# Patient Record
Sex: Female | Born: 1960 | Race: White | Hispanic: No | Marital: Married | Smoking: Former smoker
Health system: Southern US, Community
[De-identification: ages and names within clinical notes are randomized; demographics above are authoritative.]

## PROBLEM LIST (undated history)

## (undated) DIAGNOSIS — Z8701 Personal history of pneumonia (recurrent): Secondary | ICD-10-CM

## (undated) DIAGNOSIS — E785 Hyperlipidemia, unspecified: Secondary | ICD-10-CM

## (undated) DIAGNOSIS — B9681 Helicobacter pylori [H. pylori] as the cause of diseases classified elsewhere: Principal | ICD-10-CM

## (undated) DIAGNOSIS — K259 Gastric ulcer, unspecified as acute or chronic, without hemorrhage or perforation: Secondary | ICD-10-CM

## (undated) DIAGNOSIS — M549 Dorsalgia, unspecified: Secondary | ICD-10-CM

## (undated) DIAGNOSIS — Z862 Personal history of diseases of the blood and blood-forming organs and certain disorders involving the immune mechanism: Secondary | ICD-10-CM

## (undated) DIAGNOSIS — M1990 Unspecified osteoarthritis, unspecified site: Secondary | ICD-10-CM

## (undated) DIAGNOSIS — K5909 Other constipation: Secondary | ICD-10-CM

## (undated) DIAGNOSIS — G8929 Other chronic pain: Secondary | ICD-10-CM

## (undated) DIAGNOSIS — Z8709 Personal history of other diseases of the respiratory system: Secondary | ICD-10-CM

## (undated) DIAGNOSIS — R51 Headache: Secondary | ICD-10-CM

## (undated) DIAGNOSIS — K2970 Gastritis, unspecified, without bleeding: Principal | ICD-10-CM

## (undated) DIAGNOSIS — F419 Anxiety disorder, unspecified: Secondary | ICD-10-CM

## (undated) DIAGNOSIS — U071 COVID-19: Secondary | ICD-10-CM

## (undated) DIAGNOSIS — R030 Elevated blood-pressure reading, without diagnosis of hypertension: Secondary | ICD-10-CM

## (undated) DIAGNOSIS — A4902 Methicillin resistant Staphylococcus aureus infection, unspecified site: Secondary | ICD-10-CM

## (undated) DIAGNOSIS — G4700 Insomnia, unspecified: Secondary | ICD-10-CM

## (undated) HISTORY — DX: Hyperlipidemia, unspecified: E78.5

## (undated) HISTORY — DX: Gastritis, unspecified, without bleeding: K29.70

## (undated) HISTORY — DX: Anxiety disorder, unspecified: F41.9

## (undated) HISTORY — DX: Elevated blood-pressure reading, without diagnosis of hypertension: R03.0

## (undated) HISTORY — PX: CARPAL TUNNEL RELEASE: SHX101

## (undated) HISTORY — DX: Morbid (severe) obesity due to excess calories: E66.01

## (undated) HISTORY — DX: Helicobacter pylori (H. pylori) as the cause of diseases classified elsewhere: B96.81

## (undated) HISTORY — PX: TUBAL LIGATION: SHX77

## (undated) HISTORY — DX: Methicillin resistant Staphylococcus aureus infection, unspecified site: A49.02

## (undated) HISTORY — DX: Personal history of pneumonia (recurrent): Z87.01

## (undated) HISTORY — DX: Other constipation: K59.09

## (undated) HISTORY — DX: Personal history of diseases of the blood and blood-forming organs and certain disorders involving the immune mechanism: Z86.2

---

## 2002-12-23 HISTORY — PX: GALLBLADDER SURGERY: SHX652

## 2002-12-30 ENCOUNTER — Ambulatory Visit (HOSPITAL_COMMUNITY): Admission: RE | Admit: 2002-12-30 | Discharge: 2002-12-30 | Payer: Self-pay | Admitting: Family Medicine

## 2002-12-30 ENCOUNTER — Encounter: Payer: Self-pay | Admitting: Family Medicine

## 2003-01-05 ENCOUNTER — Encounter: Payer: Self-pay | Admitting: Family Medicine

## 2003-01-05 ENCOUNTER — Ambulatory Visit (HOSPITAL_COMMUNITY): Admission: RE | Admit: 2003-01-05 | Discharge: 2003-01-05 | Payer: Self-pay | Admitting: Family Medicine

## 2003-06-04 ENCOUNTER — Emergency Department (HOSPITAL_COMMUNITY): Admission: EM | Admit: 2003-06-04 | Discharge: 2003-06-04 | Payer: Self-pay | Admitting: *Deleted

## 2003-06-06 ENCOUNTER — Ambulatory Visit (HOSPITAL_COMMUNITY): Admission: RE | Admit: 2003-06-06 | Discharge: 2003-06-06 | Payer: Self-pay | Admitting: Unknown Physician Specialty

## 2003-06-06 ENCOUNTER — Encounter: Payer: Self-pay | Admitting: *Deleted

## 2003-06-14 ENCOUNTER — Encounter: Payer: Self-pay | Admitting: General Surgery

## 2003-06-14 ENCOUNTER — Ambulatory Visit (HOSPITAL_COMMUNITY): Admission: RE | Admit: 2003-06-14 | Discharge: 2003-06-14 | Payer: Self-pay | Admitting: General Surgery

## 2004-03-29 ENCOUNTER — Ambulatory Visit (HOSPITAL_COMMUNITY): Admission: RE | Admit: 2004-03-29 | Discharge: 2004-03-29 | Payer: Self-pay | Admitting: Family Medicine

## 2004-07-30 ENCOUNTER — Ambulatory Visit (HOSPITAL_COMMUNITY): Admission: RE | Admit: 2004-07-30 | Discharge: 2004-07-30 | Payer: Self-pay | Admitting: Family Medicine

## 2004-08-16 ENCOUNTER — Ambulatory Visit (HOSPITAL_COMMUNITY): Admission: RE | Admit: 2004-08-16 | Discharge: 2004-08-16 | Payer: Self-pay | Admitting: Family Medicine

## 2005-05-16 ENCOUNTER — Emergency Department (HOSPITAL_COMMUNITY): Admission: EM | Admit: 2005-05-16 | Discharge: 2005-05-16 | Payer: Self-pay | Admitting: Emergency Medicine

## 2005-05-17 ENCOUNTER — Ambulatory Visit (HOSPITAL_COMMUNITY): Admission: RE | Admit: 2005-05-17 | Discharge: 2005-05-17 | Payer: Self-pay | Admitting: Family Medicine

## 2006-07-21 ENCOUNTER — Ambulatory Visit (HOSPITAL_COMMUNITY): Admission: RE | Admit: 2006-07-21 | Discharge: 2006-07-21 | Payer: Self-pay | Admitting: Family Medicine

## 2007-01-22 ENCOUNTER — Ambulatory Visit (HOSPITAL_COMMUNITY): Admission: RE | Admit: 2007-01-22 | Discharge: 2007-01-22 | Payer: Self-pay | Admitting: Family Medicine

## 2007-01-27 ENCOUNTER — Ambulatory Visit (HOSPITAL_COMMUNITY): Admission: RE | Admit: 2007-01-27 | Discharge: 2007-01-27 | Payer: Self-pay | Admitting: Family Medicine

## 2009-02-03 ENCOUNTER — Ambulatory Visit (HOSPITAL_COMMUNITY): Admission: RE | Admit: 2009-02-03 | Discharge: 2009-02-03 | Payer: Self-pay | Admitting: Internal Medicine

## 2009-06-23 ENCOUNTER — Ambulatory Visit (HOSPITAL_COMMUNITY): Admission: RE | Admit: 2009-06-23 | Discharge: 2009-06-23 | Payer: Self-pay | Admitting: Family Medicine

## 2010-03-13 ENCOUNTER — Ambulatory Visit (HOSPITAL_COMMUNITY): Admission: RE | Admit: 2010-03-13 | Discharge: 2010-03-13 | Payer: Self-pay | Admitting: Family Medicine

## 2011-04-05 ENCOUNTER — Other Ambulatory Visit (HOSPITAL_COMMUNITY)
Admission: RE | Admit: 2011-04-05 | Discharge: 2011-04-05 | Disposition: A | Discharge status: Home / Self Care | Payer: Managed Care, Other (non HMO) | Source: Ambulatory Visit | Attending: Obstetrics & Gynecology | Admitting: Obstetrics & Gynecology

## 2011-04-05 ENCOUNTER — Other Ambulatory Visit: Payer: Self-pay | Admitting: Obstetrics & Gynecology

## 2011-04-05 DIAGNOSIS — Z01419 Encounter for gynecological examination (general) (routine) without abnormal findings: Secondary | ICD-10-CM | POA: Insufficient documentation

## 2011-05-10 NOTE — H&P (Signed)
NAME:  Tammy Webster, Tammy Webster NO.:  [id number]   MEDICAL RECORD NO.:  [id number]                   PATIENT TYPE:  OUT   LOCATION:  RAD                                  FACILITY:  [facility]   PHYSICIAN:  Dalia Heading, M.D.               DATE OF BIRTH:  27-Sep-1961   DATE OF ADMISSION:  06/06/2003  DATE OF DISCHARGE:  06/06/2003                                HISTORY & PHYSICAL   CHIEF COMPLAINT:  Biliary colic secondary to cholelithiasis.   HISTORY OF PRESENT ILLNESS:  The patient is a 50 year old white female who  was referred for evaluation and treatment of biliary colic secondary to  cholelithiasis.  She had an episode of right upper quadrant abdominal pain  with radiation to the right flank and back, nausea, postprandial symptoms,  fatty food intolerance, indigestion, and bloating this past weekend and was  seen in the emergency room.  She was noted to have elevated liver enzyme  tests.  An ultrasound of the gallbladder revealed cholelithiasis with a  slightly dilated common bile duct.  No choledocholithiasis was seen.  She  had an additional episode of biliary colic yesterday which resolved without  incident.   PAST MEDICAL HISTORY:  Unremarkable.   PAST SURGICAL HISTORY:  Tubal ligation.   CURRENT MEDICATIONS:  Oxycodone as needed for pain.   ALLERGIES:  No known drug allergies.   REVIEW OF SYSTEMS:  The patient does smoke a pack and one-half of cigarettes  a day.  She socially drinks alcohol.  She denies any other cardiopulmonary  difficulties or bleeding disorders.   PHYSICAL EXAMINATION:  GENERAL:  The patient is a well developed, well  nourished white female in no acute distress.  VITAL SIGNS:  She is afebrile, and vital signs are stable.  LUNGS:  Clear to auscultation with equal breath sounds bilaterally.  HEART:  Regular rate and rhythm without S3, S4, or murmurs.  ABDOMEN:  Soft with tenderness in the right upper quadrant to palpation.   No  hepatosplenomegaly, masses, or hernias are noted.   IMPRESSION:  Biliary colic, cholelithiasis.    PLAN:  The patient is scheduled for a laparoscopic cholecystectomy with  cholangiograms on 06/15/2003.  The risks and benefits of the procedure  including bleeding,infection, hepatobiliary injury, and the possibility of  an open procedure were fully explained to the patient who gave informed  consent.                                               Dalia Heading, M.D.    MAJ/MEDQ  D:  06/09/2003  T:  06/09/2003  Job:  [id number]   cc:   Kirk Ruths, M.D.  [address]  [place]  [place] [address]  Fax: [phone]

## 2011-05-10 NOTE — Op Note (Signed)
NAME:  Tammy Webster, Tammy Webster                           ACCOUNT NO.:  [id number]   MEDICAL RECORD NO.:  [id number]                   PATIENT TYPE:  AMB   LOCATION:  DAY                                  FACILITY:  [facility]   PHYSICIAN:  Dalia Heading, M.D.               DATE OF BIRTH:  [birth year]   DATE OF PROCEDURE:  06/14/2003  DATE OF DISCHARGE:                                 OPERATIVE REPORT   PREOPERATIVE DIAGNOSIS:  Acute cholecystitis, cholelithiasis.   POSTOPERATIVE DIAGNOSIS:  Acute cholecystitis, cholelithiasis.   PROCEDURE:  Laparoscopic cholecystectomy with cholangiograms.   SURGEON:  Dalia Heading, M.D.   ASSISTANT:  Bernerd Limbo. Leona Carry, M.D.   ANESTHESIA:  General endotracheal.   INDICATIONS FOR PROCEDURE:  The patient is a 50 year old white female who  was referred for evaluation and treatment of biliary colic secondary to  cholelithiasis.  She also has a slightly dilated common bile duct and  elevated liver enzymes.  The risks and benefits of the procedure, including  bleeding, infection, hepatobiliary injury, and the possibility of an open  procedure were fully explained to the patient who gave informed consent.   DESCRIPTION OF PROCEDURE:  The patient was placed in the supine position.  After induction of general endotracheal anesthesia, the abdomen was prepped  and draped using the usual sterile technique with Betadine.  Surgical site  confirmation was performed.   A supraumbilical incision was made down to the fascia.  A Veress needle was  introduced into the abdominal cavity, and confirmation of placement was done  using the saline drop test.  The abdomen was then insufflated to 16 mmHg  pressure.  An 11-mm trocar was introduced into the abdominal cavity under  direct visualization without difficulty.  The patient was placed in reverse  Trendelenburg position, and an 11-mm trocar was placed in the epigastric  region and 5-mm trocars were placed in the right upper  quadrant and right  flank regions.  The liver was inspected and noted to be within normal  limits.  The gallbladder was retracted superiorly and laterally.  The  dissection was begun around the infundibulum of the gallbladder.  The cystic  duct was first identified.  Its juncture to the infundibulum was fully  identified.  A single Endoclip was placed proximally on the cystic duct, and  an incision was made in the cystic duct.  The cholangiocatheter was then  inserted, and under digital fluoroscopy, a cholangiogram was performed.  No  hepatobiliary defects were noted.  The dye flowed freely into the duodenum.  The common bile duct was then flushed with saline, and the catheter was  removed.  Multiple Endoclips were placed distally on the cystic duct, and  the cystic duct was divided.  The cystic artery was likewise divided and  ligated.  The gallbladder was then freed away from the gallbladder fossa  using Bovie electrocautery.  The gallbladder was delivered through the  epigastric trocar site using an EndoCatch bag.  The gallbladder fossa was  inspected, and no abnormal bleeding or bile leakage was noted.  Surgicel was  placed in the gallbladder fossa.  The subhepatic space as well as right  hepatic gutter were irrigated with normal saline.  All fluid and air were  then evacuated from the abdominal cavity prior to removal of the trocars.   All wounds were irrigated with normal saline.  All wounds were injected with  0.5% Sensorcaine.  The supraumbilical fascia was reapproximated using an 0  Vicryl interrupted suture.  All skin incisions were closed using staples.  Betadine ointment and dry sterile dressings were applied.   All tape and needle counts were correct at the end of the procedure.  The  patient was extubated in the operating room and went back to the recovery  room awake and in stable condition.   COMPLICATIONS:  None.   SPECIMENS:  Gallbladder.   ESTIMATED BLOOD LOSS:   Minimal.                                               Dalia Heading, M.D.    MAJ/MEDQ  D:  06/14/2003  T:  06/14/2003  Job:  [id number]   cc:   Kirk Ruths, M.D.  [address]  [place]  [place] [address]  Fax: [phone]

## 2012-02-14 ENCOUNTER — Other Ambulatory Visit (HOSPITAL_COMMUNITY): Payer: Self-pay | Admitting: Internal Medicine

## 2012-02-14 DIAGNOSIS — Z139 Encounter for screening, unspecified: Secondary | ICD-10-CM

## 2012-02-18 ENCOUNTER — Ambulatory Visit (HOSPITAL_COMMUNITY)
Admission: RE | Admit: 2012-02-18 | Discharge: 2012-02-18 | Disposition: A | Discharge status: Home / Self Care | Payer: Managed Care, Other (non HMO) | Source: Ambulatory Visit | Attending: Internal Medicine | Admitting: Internal Medicine

## 2012-02-18 ENCOUNTER — Ambulatory Visit (HOSPITAL_COMMUNITY): Payer: Managed Care, Other (non HMO)

## 2012-02-18 DIAGNOSIS — Z1231 Encounter for screening mammogram for malignant neoplasm of breast: Secondary | ICD-10-CM | POA: Insufficient documentation

## 2012-02-18 DIAGNOSIS — Z139 Encounter for screening, unspecified: Secondary | ICD-10-CM

## 2012-03-11 ENCOUNTER — Encounter (HOSPITAL_COMMUNITY): Payer: Self-pay | Admitting: Emergency Medicine

## 2012-03-11 ENCOUNTER — Other Ambulatory Visit: Payer: Self-pay

## 2012-03-11 ENCOUNTER — Emergency Department (HOSPITAL_COMMUNITY): Payer: Managed Care, Other (non HMO)

## 2012-03-11 ENCOUNTER — Emergency Department (HOSPITAL_COMMUNITY)
Admission: EM | Admit: 2012-03-11 | Discharge: 2012-03-11 | Disposition: A | Discharge status: Home / Self Care | Payer: Managed Care, Other (non HMO) | Attending: Emergency Medicine | Admitting: Emergency Medicine

## 2012-03-11 DIAGNOSIS — R0602 Shortness of breath: Secondary | ICD-10-CM | POA: Insufficient documentation

## 2012-03-11 DIAGNOSIS — R079 Chest pain, unspecified: Secondary | ICD-10-CM | POA: Insufficient documentation

## 2012-03-11 DIAGNOSIS — M549 Dorsalgia, unspecified: Secondary | ICD-10-CM | POA: Insufficient documentation

## 2012-03-11 DIAGNOSIS — R209 Unspecified disturbances of skin sensation: Secondary | ICD-10-CM | POA: Insufficient documentation

## 2012-03-11 LAB — DIFFERENTIAL
Basophils Relative: 0 % (ref 0–1)
Eosinophils Relative: 2 % (ref 0–5)
Lymphocytes Relative: 45 % (ref 12–46)
Monocytes Absolute: 0.4 10*3/uL (ref 0.1–1.0)
Monocytes Relative: 6 % (ref 3–12)
Neutrophils Relative %: 47 % (ref 43–77)

## 2012-03-11 LAB — BASIC METABOLIC PANEL
GFR calc Af Amer: >90 mL/min (ref >90)
GFR calc non Af Amer: >90 mL/min (ref >90)
Sodium: 138 mEq/L (ref 135–145)

## 2012-03-11 LAB — CBC
Hemoglobin: 12.9 g/dL (ref 12.0–15.0)
RBC: 4.16 MIL/uL (ref 3.87–5.11)
WBC: 6.8 10*3/uL (ref 4.0–10.5)

## 2012-03-11 LAB — POCT I-STAT TROPONIN I: Troponin i, poc: 0 ng/mL (ref 0.00–0.08)

## 2012-03-11 NOTE — ED Notes (Signed)
Paramedic Tanna Furry giving report. Pt had chest discomfort and SOB and diaphoresis upon EMS arrival. Pt states has family h/o of cardiac problems. Family gave nitro x1. Pt. Reports headache during transport and 4 baby asprin were given. Pt. Put on 4 L 02.  20 G saline lock Left AC. Pt had carpal tunnel surgery on Monday  And took hydrocodone at appox 4pm. Pt also c/o back pain.

## 2012-03-11 NOTE — Discharge Instructions (Signed)
Use Maalox before meals and at bedtime for one week. Tests today did not show any heart or lung problem. Return here if your condition worsens. Otherwise, see your Dr. for a checkup in one week.  Chest Pain (Nonspecific) It is often hard to give a specific diagnosis for the cause of chest pain. There is always a chance that your pain could be related to something serious, such as a heart attack or a blood clot in the lungs. You need to follow up with your caregiver for further evaluation. CAUSES   Heartburn.   Pneumonia or bronchitis.   Anxiety or stress.   Inflammation around your heart (pericarditis) or lung (pleuritis or pleurisy).   A blood clot in the lung.   A collapsed lung (pneumothorax). It can develop suddenly on its own (spontaneous pneumothorax) or from injury (trauma) to the chest.   Shingles infection (herpes zoster virus).  The chest wall is composed of bones, muscles, and cartilage. Any of these can be the source of the pain.  The bones can be bruised by injury.   The muscles or cartilage can be strained by coughing or overwork.   The cartilage can be affected by inflammation and become sore (costochondritis).  DIAGNOSIS  Lab tests or other studies, such as X-rays, electrocardiography, stress testing, or cardiac imaging, may be needed to find the cause of your pain.  TREATMENT   Treatment depends on what may be causing your chest pain. Treatment may include:   Acid blockers for heartburn.   Anti-inflammatory medicine.   Pain medicine for inflammatory conditions.   Antibiotics if an infection is present.   You may be advised to change lifestyle habits. This includes stopping smoking and avoiding alcohol, caffeine, and chocolate.   You may be advised to keep your head raised (elevated) when sleeping. This reduces the chance of acid going backward from your stomach into your esophagus.   Most of the time, nonspecific chest pain will improve within 2 to 3 days  with rest and mild pain medicine.  HOME CARE INSTRUCTIONS   If antibiotics were prescribed, take your antibiotics as directed. Finish them even if you start to feel better.   For the next few days, avoid physical activities that bring on chest pain. Continue physical activities as directed.   Do not smoke.   Avoid drinking alcohol.   Only take over-the-counter or prescription medicine for pain, discomfort, or fever as directed by your caregiver.   Follow your caregiver's suggestions for further testing if your chest pain does not go away.   Keep any follow-up appointments you made. If you do not go to an appointment, you could develop lasting (chronic) problems with pain. If there is any problem keeping an appointment, you must call to reschedule.  SEEK MEDICAL CARE IF:   You think you are having problems from the medicine you are taking. Read your medicine instructions carefully.   Your chest pain does not go away, even after treatment.   You develop a rash with blisters on your chest.  SEEK IMMEDIATE MEDICAL CARE IF:   You have increased chest pain or pain that spreads to your arm, neck, jaw, back, or abdomen.   You develop shortness of breath, an increasing cough, or you are coughing up blood.   You have severe back or abdominal pain, feel nauseous, or vomit.   You develop severe weakness, fainting, or chills.   You have a fever.  THIS IS AN EMERGENCY. Do not wait  to see if the pain will go away. Get medical help at once. Call your local emergency services (911 in [place] Do not drive yourself to the hospital. MAKE SURE YOU:   Understand these instructions.   Will watch your condition.   Will get help right away if you are not doing well or get worse.  Document Released: 09/18/2005 Document Revised: 11/28/2011 Document Reviewed: 07/14/2008 [facility] Patient Information 2012 [place], [place].

## 2012-03-11 NOTE — ED Provider Notes (Signed)
History   This chart was scribed for Flint Melter, MD by Sofie Rower. The patient was seen in room APA08/APA08 and the patient's care was started at 8:32PM.    CSN: [id number]  Arrival date & time 03/11/12  1825   First MD Initiated Contact with Patient 03/11/12 2027      Chief Complaint  Patient presents with  . Chest Pain    (Consider location/radiation/quality/duration/timing/severity/associated sxs/prior treatment) HPI  Tammy Webster is a 51 y.o. female who presents to the Emergency Department complaining of moderate, episodic chest pain  onset today with associated symptoms of shortness of breath, back pain, numbness in the left leg. Pt states "she was working on her taxes when the pain came on". Pt rates the pain at a 9/10 at present. Pt relative states "pt has been under a lot of stress lately due to her father having cancer and there is nothing that can be done." Pt states "the pain is similar to when she had her gall bladder taken out." Modifying factors include nitroglycerin, baby aspirin which moderately relieves the pain. Pt has a hx of carpal tunnel on her right hand, for which she had surgery on 03/10/11, tubal ligation, cholecystectomy, familial hx of heart problems.  Pt denies cough, fever, nausea, vomiting, smoking, bronchitis, asthma, Pt works in Designer, fashion/clothing.   PCP is Dr. Regino Schultze.    Past Surgical History  Procedure Date  . Carpal tunnel release     left done 2 years ago. right done 03/18    History reviewed. No pertinent family history.  History  Substance Use Topics  . Smoking status: Former Games developer  . Smokeless tobacco: Not on file  . Alcohol Use: No    OB History    Grav Para Term Preterm Abortions TAB SAB Ect Mult Living                  Review of Systems  All other systems reviewed and are negative.   10 Systems reviewed and are negative for acute change except as noted in the HPI.   Allergies  Review of patient's allergies indicates no known  allergies.  Home Medications   Current Outpatient Rx  Name Route Sig Dispense Refill  . HYDROCODONE-ACETAMINOPHEN 5-325 MG PO TABS Oral Take 1 tablet by mouth every 6 (six) hours as needed.    Marland Kitchen LORAZEPAM 1 MG PO TABS Oral Take 1 mg by mouth every 8 (eight) hours as needed.    Marland Kitchen NITROGLYCERIN 0.4 MG SL SUBL Sublingual Place 0.4 mg under the tongue once as needed. For chest pain      BP 112/68  Pulse 65  Temp(Src) 97.9 F (36.6 C) (Oral)  Resp 16  Wt 218 lb (98.884 kg)  SpO2 97%  Physical Exam  Nursing note and vitals reviewed. Constitutional: She is oriented to person, place, and time. She appears well-developed and well-nourished.  HENT:  Head: Normocephalic and atraumatic.  Right Ear: External ear normal.  Left Ear: External ear normal.  Nose: Nose normal.  Mouth/Throat: Oropharynx is clear and moist.  Eyes: Conjunctivae and EOM are normal. Pupils are equal, round, and reactive to light.  Neck: Normal range of motion and phonation normal. Neck supple.  Cardiovascular: Normal rate, regular rhythm, normal heart sounds and intact distal pulses.   Pulmonary/Chest: Effort normal and breath sounds normal. She has no wheezes. She has no rales. She exhibits no tenderness.  Abdominal: Soft. She exhibits no distension. There is no tenderness. There is  no guarding.  Musculoskeletal: Normal range of motion. She exhibits no edema and no tenderness.  Neurological: She is alert and oriented to person, place, and time. She has normal strength. She exhibits normal muscle tone.  Skin: Skin is warm and dry.  Psychiatric: She has a normal mood and affect. Her behavior is normal. Judgment and thought content normal.    ED Course  Procedures (including critical care time)  DIAGNOSTIC STUDIES: Oxygen Saturation is 97% on room air, adequate by my interpretation.    COORDINATION OF CARE:   Date: 03/11/2012  Rate: 59  Rhythm: sinus bradycardia  QRS Axis: normal  Intervals: normal  ST/T  Wave abnormalities: normal  Conduction Disutrbances:none  Narrative Interpretation:   Old EKG Reviewed: none available    Labs Reviewed  BASIC METABOLIC PANEL - Abnormal; Notable for the following:    Glucose, Bld 114 (*)    All other components within normal limits  CBC  DIFFERENTIAL  POCT I-STAT TROPONIN I   Dg Chest Portable 1 View  03/11/2012  *RADIOLOGY REPORT*  Clinical Data: Chest pain, former smoker  PORTABLE CHEST - 1 VIEW  Comparison: Portable exam 2012 hours compared to 06/23/2009  Findings: Enlargement of cardiac silhouette. Mediastinal contours and pulmonary vascularity normal. Minimal bibasilar atelectasis. Lungs otherwise clear. No pleural effusion or pneumothorax.  IMPRESSION: Enlargement of cardiac silhouette. No acute abnormalities.  Original Report Authenticated By: Lollie Marrow, M.D.     1. Chest pain     8:39PM- EDP at bedside discusses treatment plan.   10:24PM- Recheck. EDP at bedside discusses treatment plan.   MDM  Nonspecific transient chest pain, unlikely to be ACS, PE, pneumonia, or occult infection. She is stable for discharge with outpatient treatment.       I personally performed the services described in this documentation, which was scribed in my presence. The recorded information has been reviewed and considered.    Plan: Home Medications- Antacids AC and HS. ; Home Treatments- Rest, Fluids; Recommended follow up- PCP 1 week       Flint Melter, MD 03/11/12 2253

## 2012-04-02 ENCOUNTER — Ambulatory Visit (INDEPENDENT_AMBULATORY_CARE_PROVIDER_SITE_OTHER): Payer: Managed Care, Other (non HMO) | Admitting: Gastroenterology

## 2012-04-02 ENCOUNTER — Encounter: Payer: Self-pay | Admitting: Gastroenterology

## 2012-04-02 VITALS — BP 124/79 | HR 66 | Temp 97.9°F | Ht 66.0 in | Wt 222.8 lb

## 2012-04-02 DIAGNOSIS — K5909 Other constipation: Secondary | ICD-10-CM

## 2012-04-02 DIAGNOSIS — K625 Hemorrhage of anus and rectum: Secondary | ICD-10-CM

## 2012-04-02 DIAGNOSIS — R1314 Dysphagia, pharyngoesophageal phase: Secondary | ICD-10-CM

## 2012-04-02 DIAGNOSIS — K5900 Constipation, unspecified: Secondary | ICD-10-CM

## 2012-04-02 DIAGNOSIS — R1013 Epigastric pain: Secondary | ICD-10-CM

## 2012-04-02 DIAGNOSIS — R1310 Dysphagia, unspecified: Secondary | ICD-10-CM | POA: Insufficient documentation

## 2012-04-02 DIAGNOSIS — K219 Gastro-esophageal reflux disease without esophagitis: Secondary | ICD-10-CM | POA: Insufficient documentation

## 2012-04-02 MED ORDER — OMEPRAZOLE 20 MG PO CPDR: 20.0000 mg | DELAYED_RELEASE_CAPSULE | Freq: Every day | ORAL | Status: DC

## 2012-04-02 MED ORDER — PEG-KCL-NACL-NASULF-NA ASC-C 100 G PO SOLR: 1.0000 | Freq: Once | ORAL | Status: DC

## 2012-04-02 NOTE — Progress Notes (Signed)
Primary Care Physician:  MCGOUGH,WILLIAM M, MD, MD  Primary Gastroenterologist:  Michael Rourk, MD   Chief Complaint  Patient presents with  . Constipation  . Rectal Bleeding    h/o fissure  . Abdominal Pain  . Gastrophageal Reflux    HPI:  Tammy Webster is a 51 y.o. female here as new patient, self-referral for further evaluation of chronic constipation with intermittent brbpr, epigastric pain, GERD, dysphagia. Her father, Herman W. Wilson, patient of ours, recently succumbed to gastric cancer. No FH of colon cancer.   Patient reports significant weight of 22 pounds since 08/2011. Having heartburn several days per week. Went to ED recently with c/o upper abdominal pain/chest pain like when she had gb disease. No LFTs were done. She states she was told her heart was ok and likely GI/anxiety the cause of her symptoms. C/O feeling food hang in throat and coughing with meals. C/O burning epigastric pain. Early satiety. No n/v. Chronic constiaption since a teenager. Senokot for ten years. Works good for her. Rare rectal pain. Occasional brbpr. States she has history of fissure in past but doesn't seem to bother her.  Current Outpatient Prescriptions  Medication Sig Dispense Refill  . HYDROcodone-acetaminophen (NORCO) 5-325 MG per tablet Take 1 tablet by mouth every 6 (six) hours as needed.      . LORazepam (ATIVAN) 1 MG tablet Take 1 mg by mouth every 8 (eight) hours as needed.      . nitroGLYCERIN (NITROSTAT) 0.4 MG SL tablet Place 0.4 mg under the tongue once as needed. For chest pain      . senna (SENOKOT) 8.6 MG tablet Take 1 tablet by mouth daily.      . omeprazole (PRILOSEC) 20 MG capsule Take 1 capsule (20 mg total) by mouth daily before breakfast.  30 capsule  11    Allergies as of 04/02/2012  . (No Known Allergies)    Past Medical History  Diagnosis Date  . Chronic constipation     Past Surgical History  Procedure Date  . Carpal tunnel release     left, 2011. right,  03/09/12  . Gallbladder surgery 2004  . Tubal ligation     Family History  Problem Relation Age of Onset  . Colon polyps Father   . Cancer Father     gastric, deceased 2013  . Liver disease Neg Hx     History   Social History  . Marital Status: Married    Spouse Name: N/A    Number of Children: 2  . Years of Education: N/A   Occupational History  . Not on file.   Social History Main Topics  . Smoking status: Former Smoker    Quit date: 03/16/2010  . Smokeless tobacco: Not on file  . Alcohol Use: No  . Drug Use: No  . Sexually Active: Not Currently    Birth Control/ Protection: None   Other Topics Concern  . Not on file   Social History Narrative  . No narrative on file      ROS:  General: Negative for anorexia, weight loss, fever, chills, fatigue, weakness. Eyes: Negative for vision changes.  ENT: Negative for hoarseness,  nasal congestion. See HPI. CV: Negative for chest pain, angina, palpitations, dyspnea on exertion, peripheral edema. See hpi. Respiratory: Negative for dyspnea at rest, dyspnea on exertion, cough, sputum, wheezing.  GI: See history of present illness. GU:  Negative for dysuria, hematuria, urinary incontinence, urinary frequency, nocturnal urination.  MS: Negative for joint   pain, low back pain. Recovering from carpel tunnel release on right hand.  Derm: Negative for rash or itching.  Neuro: Negative for weakness, abnormal sensation, seizure, frequent headaches, memory loss, confusion.  Psych: Negative for anxiety,  suicidal ideation, hallucinations.Situational depression related to father's death.  Endo: Negative for unusual weight change.  Heme: Negative for bruising or bleeding. Allergy: Negative for rash or hives.    Physical Examination:  BP 124/79  Pulse 66  Temp(Src) 97.9 F (36.6 C) (Temporal)  Ht 5' 6" (1.676 m)  Wt 222 lb 12.8 oz (101.061 kg)  BMI 35.96 kg/m2   General: Well-nourished, well-developed in no acute distress.  Obese. Head: Normocephalic, atraumatic.   Eyes: Conjunctiva pink, no icterus. Mouth: Oropharyngeal mucosa moist and pink , no lesions erythema or exudate. Neck: Supple without thyromegaly, masses, or lymphadenopathy.  Lungs: Clear to auscultation bilaterally.  Heart: Regular rate and rhythm, no murmurs rubs or gallops.  Abdomen: Bowel sounds are normal, nontender, nondistended, no hepatosplenomegaly or masses, no abdominal bruits or    hernia , no rebound or guarding.   Rectal: defer to time of tcs Extremities: No lower extremity edema. No clubbing or deformities.  Neuro: Alert and oriented x 4 , grossly normal neurologically.  Skin: Warm and dry, no rash or jaundice.   Psych: Alert and cooperative, normal mood and affect.  Labs: Lab Results  Component Value Date   WBC 6.8 03/11/2012   HGB 12.9 03/11/2012   HCT 38.1 03/11/2012   MCV 91.6 03/11/2012   PLT 210 03/11/2012   Lab Results  Component Value Date   CREATININE 0.73 03/11/2012   BUN 18 03/11/2012   NA 138 03/11/2012   K 3.7 03/11/2012   CL 101 03/11/2012   CO2 28 03/11/2012     Imaging Studies: Dg Chest Portable 1 View  03/11/2012  *RADIOLOGY REPORT*  Clinical Data: Chest pain, former smoker  PORTABLE CHEST - 1 VIEW  Comparison: Portable exam 2012 hours compared to 06/23/2009  Findings: Enlargement of cardiac silhouette. Mediastinal contours and pulmonary vascularity normal. Minimal bibasilar atelectasis. Lungs otherwise clear. No pleural effusion or pneumothorax.  IMPRESSION: Enlargement of cardiac silhouette. No acute abnormalities.  Original Report Authenticated By: MARK A. BOLES, M.D.       

## 2012-04-02 NOTE — Assessment & Plan Note (Addendum)
Several month h/o epigastric pain, esophageal dysphagia, GERD. Tried OTC antacids without relief. No chronic PPI. No prior EGD. Father recently succumbed to gastric cancer but no guidelines regarding to recommend EGD in family members. Offered EGD/ED based on current symptoms. Start omeprazole 20mg  daily before breakfast. RX to [employer]. Anti-reflux measures.  I have discussed the risks, alternatives, benefits with regards to but not limited to the risk of reaction to medication, bleeding, infection, perforation and the patient is agreeable to proceed. Written consent to be obtained.  Recommend LFTs at time of physical next month given recent weight gain and to screen for fatty liver more than anything.

## 2012-04-02 NOTE — Assessment & Plan Note (Addendum)
Chronic constipation with intermittent rectal bleeding. Remote anorectal fissure per patient. Pleased with current bowel regimen but concerned about ongoing rectal bleeding. Recommend high fiber diet and exercise. Recommend colonoscopy.  I have discussed the risks, alternatives, benefits with regards to but not limited to the risk of reaction to medication, bleeding, infection, perforation and the patient is agreeable to proceed. Written consent to be obtained.

## 2012-04-02 NOTE — Patient Instructions (Addendum)
We have scheduled you for an upper endoscopy and colonoscopy. Please consume high fiber diet. Please start omeprazole 20mg  30 minutes before breakfast to treat your frequent heartburn and upper abdominal pain. RX sent to your pharmacy.  Gastroesophageal Reflux Disease, Adult Gastroesophageal reflux disease (GERD) happens when acid from your stomach flows up into the esophagus. When acid comes in contact with the esophagus, the acid causes soreness (inflammation) in the esophagus. Over time, GERD may create small holes (ulcers) in the lining of the esophagus. CAUSES   Increased body weight. This puts pressure on the stomach, making acid rise from the stomach into the esophagus.   Smoking. This increases acid production in the stomach.   Drinking alcohol. This causes decreased pressure in the lower esophageal sphincter (valve or ring of muscle between the esophagus and stomach), allowing acid from the stomach into the esophagus.   Late evening meals and a full stomach. This increases pressure and acid production in the stomach.   A malformed lower esophageal sphincter.  Sometimes, no cause is found. SYMPTOMS   Burning pain in the lower part of the mid-chest behind the breastbone and in the mid-stomach area. This may occur twice a week or more often.   Trouble swallowing.   Sore throat.   Dry cough.   Asthma-like symptoms including chest tightness, shortness of breath, or wheezing.  DIAGNOSIS  Your caregiver may be able to diagnose GERD based on your symptoms. In some cases, X-rays and other tests may be done to check for complications or to check the condition of your stomach and esophagus. TREATMENT  Your caregiver may recommend over-the-counter or prescription medicines to help decrease acid production. Ask your caregiver before starting or adding any new medicines.  HOME CARE INSTRUCTIONS   Change the factors that you can control. Ask your caregiver for guidance concerning weight  loss, quitting smoking, and alcohol consumption.   Avoid foods and drinks that make your symptoms worse, such as:   Caffeine or alcoholic drinks.   Chocolate.   Peppermint or mint flavorings.   Garlic and onions.   Spicy foods.   Citrus fruits, such as oranges, lemons, or limes.   Tomato-based foods such as sauce, chili, salsa, and pizza.   Fried and fatty foods.   Avoid lying down for the 3 hours prior to your bedtime or prior to taking a nap.   Eat small, frequent meals instead of large meals.   Wear loose-fitting clothing. Do not wear anything tight around your waist that causes pressure on your stomach.   Raise the head of your bed 6 to 8 inches with wood blocks to help you sleep. Extra pillows will not help.   Only take over-the-counter or prescription medicines for pain, discomfort, or fever as directed by your caregiver.   Do not take aspirin, ibuprofen, or other nonsteroidal anti-inflammatory drugs (NSAIDs).  SEEK IMMEDIATE MEDICAL CARE IF:   You have pain in your arms, neck, jaw, teeth, or back.   Your pain increases or changes in intensity or duration.   You develop nausea, vomiting, or sweating (diaphoresis).   You develop shortness of breath, or you faint.   Your vomit is green, yellow, black, or looks like coffee grounds or blood.   Your stool is red, bloody, or black.  These symptoms could be signs of other problems, such as heart disease, gastric bleeding, or esophageal bleeding. MAKE SURE YOU:   Understand these instructions.   Will watch your condition.   Will get  help right away if you are not doing well or get worse.  Document Released: 09/18/2005 Document Revised: 11/28/2011 Document Reviewed: 06/28/2011 [facility] Patient Information 2012 [place], [place].  High Fiber Diet A high fiber diet changes your normal diet to include more whole grains, legumes, fruits, and vegetables. Changes in the diet involve replacing refined carbohydrates with  unrefined foods. The calorie level of the diet is essentially unchanged. The Dietary Reference Intake (recommended amount) for adult males is 38 g per day. For adult females, it is 25 g per day. Pregnant and lactating women should consume 28 g of fiber per day. Fiber is the intact part of a plant that is not broken down during digestion. Functional fiber is fiber that has been isolated from the plant to provide a beneficial effect in the body. PURPOSE  Increase stool bulk.   Ease and regulate bowel movements.   Lower cholesterol.  INDICATIONS THAT YOU NEED MORE FIBER  Constipation and hemorrhoids.   Uncomplicated diverticulosis (intestine condition) and irritable bowel syndrome.   Weight management.   As a protective measure against hardening of the arteries (atherosclerosis), diabetes, and cancer.  NOTE OF CAUTION If you have a digestive or bowel problem, ask your caregiver for advice before adding high fiber foods to your diet. Some of the following medical problems are such that a high fiber diet should not be used without consulting your caregiver:  Acute diverticulitis (intestine infection).   Partial small bowel obstructions.   Complicated diverticular disease involving bleeding, rupture (perforation), or abscess (boil, furuncle).   Presence of autonomic neuropathy (nerve damage) or gastric paresis (stomach cannot empty itself).  GUIDELINES FOR INCREASING FIBER  Start adding fiber to the diet slowly. A gradual increase of about 5 more grams (2 slices of whole-wheat bread, 2 servings of most fruits or vegetables, or 1 bowl of high fiber cereal) per day is best. Too rapid an increase in fiber may result in constipation, flatulence, and bloating.   Drink enough water and fluids to keep your urine clear or pale yellow. Water, juice, or caffeine-free drinks are recommended. Not drinking enough fluid may cause constipation.   Eat a variety of high fiber foods rather than one type of  fiber.   Try to increase your intake of fiber through using high fiber foods rather than fiber pills or supplements that contain small amounts of fiber.   The goal is to change the types of food eaten. Do not supplement your present diet with high fiber foods, but replace foods in your present diet.  INCLUDE A VARIETY OF FIBER SOURCES  Replace refined and processed grains with whole grains, canned fruits with fresh fruits, and incorporate other fiber sources. White rice, white breads, and most bakery goods contain little or no fiber.   Brown whole-grain rice, buckwheat oats, and many fruits and vegetables are all good sources of fiber. These include: broccoli, Brussels sprouts, cabbage, cauliflower, beets, sweet potatoes, white potatoes (skin on), carrots, tomatoes, eggplant, squash, berries, fresh fruits, and dried fruits.   Cereals appear to be the richest source of fiber. Cereal fiber is found in whole grains and bran. Bran is the fiber-rich outer coat of cereal grain, which is largely removed in refining. In whole-grain cereals, the bran remains. In breakfast cereals, the largest amount of fiber is found in those with "bran" in their names. The fiber content is sometimes indicated on the label.   You may need to include additional fruits and vegetables each day.  In baking, for 1 cup white flour, you may use the following substitutions:   1 cup whole-wheat flour minus 2 tbs.    cup white flour plus  cup whole-wheat flour.  Document Released: 12/09/2005 Document Revised: 11/28/2011 Document Reviewed: 10/17/2009 [facility] Patient Information 2012 [place], [place].

## 2012-04-03 NOTE — Progress Notes (Signed)
Faxed to PCP

## 2012-04-10 MED ORDER — SODIUM CHLORIDE 0.45 % IV SOLN
Freq: Once | INTRAVENOUS | Status: AC
  Administered 2012-04-13: 12:00:00 via INTRAVENOUS

## 2012-04-13 ENCOUNTER — Encounter (HOSPITAL_COMMUNITY)
Admission: RE | Disposition: A | Discharge status: Home / Self Care | Payer: Self-pay | Source: Ambulatory Visit | Attending: Internal Medicine

## 2012-04-13 ENCOUNTER — Ambulatory Visit (HOSPITAL_COMMUNITY)
Admission: RE | Admit: 2012-04-13 | Discharge: 2012-04-13 | Disposition: A | Discharge status: Home / Self Care | Payer: Managed Care, Other (non HMO) | Source: Ambulatory Visit | Attending: Internal Medicine | Admitting: Internal Medicine

## 2012-04-13 ENCOUNTER — Encounter (HOSPITAL_COMMUNITY): Payer: Self-pay | Admitting: *Deleted

## 2012-04-13 DIAGNOSIS — K625 Hemorrhage of anus and rectum: Secondary | ICD-10-CM

## 2012-04-13 DIAGNOSIS — K2940 Chronic atrophic gastritis without bleeding: Secondary | ICD-10-CM | POA: Insufficient documentation

## 2012-04-13 DIAGNOSIS — A048 Other specified bacterial intestinal infections: Secondary | ICD-10-CM | POA: Diagnosis not present

## 2012-04-13 DIAGNOSIS — K5909 Other constipation: Secondary | ICD-10-CM

## 2012-04-13 DIAGNOSIS — K921 Melena: Secondary | ICD-10-CM

## 2012-04-13 DIAGNOSIS — K648 Other hemorrhoids: Secondary | ICD-10-CM | POA: Insufficient documentation

## 2012-04-13 DIAGNOSIS — R1310 Dysphagia, unspecified: Secondary | ICD-10-CM | POA: Insufficient documentation

## 2012-04-13 DIAGNOSIS — K2960 Other gastritis without bleeding: Secondary | ICD-10-CM

## 2012-04-13 DIAGNOSIS — R1013 Epigastric pain: Secondary | ICD-10-CM

## 2012-04-13 DIAGNOSIS — B9681 Helicobacter pylori [H. pylori] as the cause of diseases classified elsewhere: Secondary | ICD-10-CM

## 2012-04-13 HISTORY — PX: ESOPHAGOGASTRODUODENOSCOPY: SHX1529

## 2012-04-13 HISTORY — PX: MALONEY DILATION: SHX5535

## 2012-04-13 HISTORY — PX: SAVORY DILATION: SHX5439

## 2012-04-13 HISTORY — DX: Helicobacter pylori (H. pylori) as the cause of diseases classified elsewhere: B96.81

## 2012-04-13 HISTORY — PX: COLONOSCOPY: SHX174

## 2012-04-13 SURGERY — COLONOSCOPY WITH ESOPHAGOGASTRODUODENOSCOPY (EGD)
Anesthesia: Moderate Sedation

## 2012-04-13 MED ORDER — MIDAZOLAM HCL 5 MG/5ML IJ SOLN
INTRAMUSCULAR | Status: DC | PRN
  Administered 2012-04-13 (×2): 2 mg via INTRAVENOUS
  Administered 2012-04-13 (×2): 1 mg via INTRAVENOUS

## 2012-04-13 MED ORDER — MEPERIDINE HCL 100 MG/ML IJ SOLN
INTRAMUSCULAR | Status: AC
  Filled 2012-04-13: qty 1

## 2012-04-13 MED ORDER — HYDROCORTISONE ACETATE 25 MG RE SUPP: 25.0000 mg | Freq: Two times a day (BID) | RECTAL | Status: AC

## 2012-04-13 MED ORDER — STERILE WATER FOR IRRIGATION IR SOLN
Status: DC | PRN
  Administered 2012-04-13: 13:00:00

## 2012-04-13 MED ORDER — MEPERIDINE HCL 100 MG/ML IJ SOLN
INTRAMUSCULAR | Status: DC | PRN
  Administered 2012-04-13: 25 mg via INTRAVENOUS
  Administered 2012-04-13 (×2): 50 mg via INTRAVENOUS
  Administered 2012-04-13: 25 mg via INTRAVENOUS

## 2012-04-13 MED ORDER — BUTAMBEN-TETRACAINE-BENZOCAINE 2-2-14 % EX AERO
INHALATION_SPRAY | CUTANEOUS | Status: DC | PRN
  Administered 2012-04-13: 2 via TOPICAL

## 2012-04-13 MED ORDER — MIDAZOLAM HCL 5 MG/5ML IJ SOLN
INTRAMUSCULAR | Status: AC
  Filled 2012-04-13: qty 10

## 2012-04-13 NOTE — Op Note (Signed)
[facility] [address] [place], [place]  [address]  ENDOSCOPY PROCEDURE REPORT  PATIENT:  Tammy Webster, Tammy Webster  MR#:  [id number] BIRTHDATE:  December 26, 1960, 50 yrs. old  GENDER:  female  ENDOSCOPIST:  R. Roetta Sessions, MD FACP [facility] Referred by:  Quita Skye. Allred, M.D. Karleen Hampshire, M.D.  PROCEDURE DATE:  04/13/2012 PROCEDURE:  EGD with Elease Hashimoto dilation followed by gastric biopsy  INDICATIONS:   epigastric pain and esophageal dysphagia  INFORMED CONSENT:   The risks, benefits, limitations, alternatives and imponderables have been discussed.  The potential for biopsy, esophogeal dilation, etc. have also been reviewed.  Questions have been answered.  All parties agreeable.  Please see the history and physical in the medical record for more information.  MEDICATIONS:    Versed 5 mg IV and Demerol 125 mg in divided doses.  DESCRIPTION OF PROCEDURE:   The EG-[id number] ([id number]) endoscope was introduced through the mouth and advanced to the second portion of the duodenum without difficulty or limitations.  The mucosal surfaces were surveyed very carefully during advancement of the scope and upon withdrawal.  Retroflexion view of the proximal stomach and esophagogastric junction was performed.  <<PROCEDUREIMAGES>>  FINDINGS: Normal-appearing, patent tubular esophagus. Stomach empty. Some mosaic appearing mucosa in the fundus and body; couple of tiny antral erosions; no ulcer or infiltrating process.  Patent pylorus. The first and second portion of the duodenum appear normal.`  THERAPEUTIC / DIAGNOSTIC MANEUVERS PERFORMED:  A 54 French Maloney dilator was passed to full insertion easily. A look back revealed no apparent complication with this maneuver. Subsequently, biopsies of gastric mucosa were taken for histologic study. COMPLICATIONS:   None  IMPRESSION: Normal esophagus-status post dilation as described above. Abnormal gastric mucosa with gastric erosions of  uncertain significance                 status post biopsy  RECOMMENDATIONS:  Followup on pathology. See colonoscopy report.  ______________________________ R. Roetta Sessions, MD Caleen Essex  CC:  n. eSIGNED:   R. Casimiro Needle Blessing Ozga at 04/13/2012 01:03 PM  Belva Agee, [phone]

## 2012-04-13 NOTE — H&P (View-Only) (Signed)
Primary Care Physician:  Kirk Ruths, MD, MD  Primary Gastroenterologist:  Roetta Sessions, MD   Chief Complaint  Patient presents with  . Constipation  . Rectal Bleeding    h/o fissure  . Abdominal Pain  . Gastrophageal Reflux    HPI:  Tammy Webster is a 51 y.o. female here as new patient, self-referral for further evaluation of chronic constipation with intermittent brbpr, epigastric pain, GERD, dysphagia. Her father, Raymondo Band, patient of ours, recently succumbed to gastric cancer. No FH of colon cancer.   Patient reports significant weight of 22 pounds since 08/2011. Having heartburn several days per week. Went to ED recently with c/o upper abdominal pain/chest pain like when she had gb disease. No LFTs were done. She states she was told her heart was ok and likely GI/anxiety the cause of her symptoms. C/O feeling food hang in throat and coughing with meals. C/O burning epigastric pain. Early satiety. No n/v. Chronic constiaption since a teenager. Senokot for ten years. Works good for her. Rare rectal pain. Occasional brbpr. States she has history of fissure in past but doesn't seem to bother her.  Current Outpatient Prescriptions  Medication Sig Dispense Refill  . HYDROcodone-acetaminophen (NORCO) 5-325 MG per tablet Take 1 tablet by mouth every 6 (six) hours as needed.      Marland Kitchen LORazepam (ATIVAN) 1 MG tablet Take 1 mg by mouth every 8 (eight) hours as needed.      . nitroGLYCERIN (NITROSTAT) 0.4 MG SL tablet Place 0.4 mg under the tongue once as needed. For chest pain      . senna (SENOKOT) 8.6 MG tablet Take 1 tablet by mouth daily.      Marland Kitchen omeprazole (PRILOSEC) 20 MG capsule Take 1 capsule (20 mg total) by mouth daily before breakfast.  30 capsule  11    Allergies as of 04/02/2012  . (No Known Allergies)    Past Medical History  Diagnosis Date  . Chronic constipation     Past Surgical History  Procedure Date  . Carpal tunnel release     left, 10-Jun-2010. right,  03/09/12  . Gallbladder surgery 06/11/2003  . Tubal ligation     Family History  Problem Relation Age of Onset  . Colon polyps Father   . Cancer Father     gastric, deceased 06-10-2012  . Liver disease Neg Hx     History   Social History  . Marital Status: Married    Spouse Name: N/A    Number of Children: 2  . Years of Education: N/A   Occupational History  . Not on file.   Social History Main Topics  . Smoking status: Former Smoker    Quit date: 03/16/2010  . Smokeless tobacco: Not on file  . Alcohol Use: No  . Drug Use: No  . Sexually Active: Not Currently    Birth Control/ Protection: None   Other Topics Concern  . Not on file   Social History Narrative  . No narrative on file      ROS:  General: Negative for anorexia, weight loss, fever, chills, fatigue, weakness. Eyes: Negative for vision changes.  ENT: Negative for hoarseness,  nasal congestion. See HPI. CV: Negative for chest pain, angina, palpitations, dyspnea on exertion, peripheral edema. See hpi. Respiratory: Negative for dyspnea at rest, dyspnea on exertion, cough, sputum, wheezing.  GI: See history of present illness. GU:  Negative for dysuria, hematuria, urinary incontinence, urinary frequency, nocturnal urination.  MS: Negative for joint  pain, low back pain. Recovering from carpel tunnel release on right hand.  Derm: Negative for rash or itching.  Neuro: Negative for weakness, abnormal sensation, seizure, frequent headaches, memory loss, confusion.  Psych: Negative for anxiety,  suicidal ideation, hallucinations.Situational depression related to father's death.  Endo: Negative for unusual weight change.  Heme: Negative for bruising or bleeding. Allergy: Negative for rash or hives.    Physical Examination:  BP 124/79  Pulse 66  Temp(Src) 97.9 F (36.6 C) (Temporal)  Ht 5\' 6"  (1.676 m)  Wt 222 lb 12.8 oz (101.061 kg)  BMI 35.96 kg/m2   General: Well-nourished, well-developed in no acute distress.  Obese. Head: Normocephalic, atraumatic.   Eyes: Conjunctiva pink, no icterus. Mouth: Oropharyngeal mucosa moist and pink , no lesions erythema or exudate. Neck: Supple without thyromegaly, masses, or lymphadenopathy.  Lungs: Clear to auscultation bilaterally.  Heart: Regular rate and rhythm, no murmurs rubs or gallops.  Abdomen: Bowel sounds are normal, nontender, nondistended, no hepatosplenomegaly or masses, no abdominal bruits or    hernia , no rebound or guarding.   Rectal: defer to time of tcs Extremities: No lower extremity edema. No clubbing or deformities.  Neuro: Alert and oriented x 4 , grossly normal neurologically.  Skin: Warm and dry, no rash or jaundice.   Psych: Alert and cooperative, normal mood and affect.  Labs: Lab Results  Component Value Date   WBC 6.8 03/11/2012   HGB 12.9 03/11/2012   HCT 38.1 03/11/2012   MCV 91.6 03/11/2012   PLT 210 03/11/2012   Lab Results  Component Value Date   CREATININE 0.73 03/11/2012   BUN 18 03/11/2012   NA 138 03/11/2012   K 3.7 03/11/2012   CL 101 03/11/2012   CO2 28 03/11/2012     Imaging Studies: Dg Chest Portable 1 View  03/11/2012  *RADIOLOGY REPORT*  Clinical Data: Chest pain, former smoker  PORTABLE CHEST - 1 VIEW  Comparison: Portable exam 2012 hours compared to 06/23/2009  Findings: Enlargement of cardiac silhouette. Mediastinal contours and pulmonary vascularity normal. Minimal bibasilar atelectasis. Lungs otherwise clear. No pleural effusion or pneumothorax.  IMPRESSION: Enlargement of cardiac silhouette. No acute abnormalities.  Original Report Authenticated By: Lollie Marrow, M.D.

## 2012-04-13 NOTE — Interval H&P Note (Signed)
History and Physical Interval Note:  04/13/2012 12:39 PM  Tammy Webster  has presented today for surgery, with the diagnosis of RECTAL BLEEDING, EPIGASTRIC PAIN  The various methods of treatment have been discussed with the patient and family. After consideration of risks, benefits and other options for treatment, the patient has consented to  Procedure(s) (LRB): COLONOSCOPY WITH ESOPHAGOGASTRODUODENOSCOPY (EGD) (N/A) SAVORY DILATION (N/A) MALONEY DILATION (N/A) as a surgical intervention .  The patients' history has been reviewed, patient examined, no change in status, stable for surgery.  I have reviewed the patients' chart and labs.  Questions were answered to the patient's satisfaction.     [name]

## 2012-04-13 NOTE — Discharge Instructions (Addendum)
Colonoscopy Discharge Instructions  Read the instructions outlined below and refer to this sheet in the next few weeks. These discharge instructions provide you with general information on caring for yourself after you leave the hospital. Your doctor may also give you specific instructions. While your treatment has been planned according to the most current medical practices available, unavoidable complications occasionally occur. If you have any problems or questions after discharge, call Dr. Gala Romney at [phone]. ACTIVITY  You may resume your regular activity, but move at a slower pace for the next 24 hours.   Take frequent rest periods for the next 24 hours.   Walking will help get rid of the air and reduce the bloated feeling in your belly (abdomen).   No driving for 24 hours (because of the medicine (anesthesia) used during the test).    Do not sign any important legal documents or operate any machinery for 24 hours (because of the anesthesia used during the test).  NUTRITION  Drink plenty of fluids.   You may resume your normal diet as instructed by your doctor.   Begin with a light meal and progress to your normal diet. Heavy or fried foods are harder to digest and may make you feel sick to your stomach (nauseated).   Avoid alcoholic beverages for 24 hours or as instructed.  MEDICATIONS  You may resume your normal medications unless your doctor tells you otherwise.  WHAT YOU CAN EXPECT TODAY  Some feelings of bloating in the abdomen.   Passage of more gas than usual.   Spotting of blood in your stool or on the toilet paper.  IF YOU HAD POLYPS REMOVED DURING THE COLONOSCOPY:  No aspirin products for 7 days or as instructed.   No alcohol for 7 days or as instructed.   Eat a soft diet for the next 24 hours.  FINDING OUT THE RESULTS OF YOUR TEST Not all test results are available during your visit. If your test results are not back during the visit, make an appointment  with your caregiver to find out the results. Do not assume everything is normal if you have not heard from your caregiver or the medical facility. It is important for you to follow up on all of your test results.  SEEK IMMEDIATE MEDICAL ATTENTION IF:  You have more than a spotting of blood in your stool.   Your belly is swollen (abdominal distention).   You are nauseated or vomiting.   You have a temperature over 101.   You have abdominal pain or discomfort that is severe or gets worse throughout the day.   EGD Discharge instructions Please read the instructions outlined below and refer to this sheet in the next few weeks. These discharge instructions provide you with general information on caring for yourself after you leave the hospital. Your doctor may also give you specific instructions. While your treatment has been planned according to the most current medical practices available, unavoidable complications occasionally occur. If you have any problems or questions after discharge, please call your doctor. ACTIVITY  You may resume your regular activity but move at a slower pace for the next 24 hours.   Take frequent rest periods for the next 24 hours.   Walking will help expel (get rid of) the air and reduce the bloated feeling in your abdomen.   No driving for 24 hours (because of the anesthesia (medicine) used during the test).   You may shower.   Do not sign any  important legal documents or operate any machinery for 24 hours (because of the anesthesia used during the test).  NUTRITION  Drink plenty of fluids.   You may resume your normal diet.   Begin with a light meal and progress to your normal diet.   Avoid alcoholic beverages for 24 hours or as instructed by your caregiver.  MEDICATIONS  You may resume your normal medications unless your caregiver tells you otherwise.  WHAT YOU CAN EXPECT TODAY  You may experience abdominal discomfort such as a feeling of  fullness or "gas" pains.  FOLLOW-UP  Your doctor will discuss the results of your test with you.  SEEK IMMEDIATE MEDICAL ATTENTION IF ANY OF THE FOLLOWING OCCUR:  Excessive nausea (feeling sick to your stomach) and/or vomiting.   Severe abdominal pain and distention (swelling).   Trouble swallowing.   Temperature over 101 F (37.8 C).   Rectal bleeding or vomiting of blood.    Begin omeprazole 20 mg orally as previously recommended  Hemorrhoid and constipation information provided.  Anusol suppositories as directed.  Benefiber 2 tablespoons daily.  Office visit with [place] in 3 months.  Let me know if you have any recurrent bleeding.  Constipation in Adults Constipation is having fewer than 2 bowel movements per week. Usually, the stools are hard. As we grow older, constipation is more common. If you try to fix constipation with laxatives, the problem may get worse. This is because laxatives taken over a long period of time make the colon muscles weaker. A low-fiber diet, not taking in enough fluids, and taking some medicines may make these problems worse. MEDICATIONS THAT MAY CAUSE CONSTIPATION  Water pills (diuretics).   Calcium channel blockers (used to control blood pressure and for the heart).   Certain pain medicines (narcotics).   Anticholinergics.   Anti-inflammatory agents.   Antacids that contain aluminum.  DISEASES THAT CONTRIBUTE TO CONSTIPATION  Diabetes.   Parkinson's disease.   Dementia.   Stroke.   Depression.   Illnesses that cause problems with salt and water metabolism.  HOME CARE INSTRUCTIONS   Constipation is usually best cared for without medicines. Increasing dietary fiber and eating more fruits and vegetables is the best way to manage constipation.   Slowly increase fiber intake to 25 to 38 grams per day. Whole grains, fruits, vegetables, and legumes are good sources of fiber. A dietitian can further help you incorporate high-fiber  foods into your diet.   Drink enough water and fluids to keep your urine clear or pale yellow.   A fiber supplement may be added to your diet if you cannot get enough fiber from foods.   Increasing your activities also helps improve regularity.   Suppositories, as suggested by your caregiver, will also help. If you are using antacids, such as aluminum or calcium containing products, it will be helpful to switch to products containing magnesium if your caregiver says it is okay.   If you have been given a liquid injection (enema) today, this is only a temporary measure. It should not be relied on for treatment of longstanding (chronic) constipation.   Stronger measures, such as magnesium sulfate, should be avoided if possible. This may cause uncontrollable diarrhea. Using magnesium sulfate may not allow you time to make it to the bathroom.  SEEK IMMEDIATE MEDICAL CARE IF:   There is bright red blood in the stool.   The constipation stays for more than 4 days.   There is belly (abdominal) or rectal pain.  You do not seem to be getting better.   You have any questions or concerns.  MAKE SURE YOU:   Understand these instructions.   Will watch your condition.   Will get help right away if you are not doing well or get worse.  Document Released: 09/06/2004 Document Revised: 11/28/2011 Document Reviewed: 11/12/2011 [facility] Patient Information 2012 [place], [place].  Hemorrhoids Hemorrhoids are enlarged (dilated) veins around the rectum. There are 2 types of hemorrhoids, and the type of hemorrhoid is determined by its location. Internal hemorrhoids occur in the veins just inside the rectum.They are usually not painful, but they may bleed.However, they may poke through to the outside and become irritated and painful. External hemorrhoids involve the veins outside the anus and can be felt as a painful swelling or hard lump near the anus.They are often itchy and may crack and bleed.  Sometimes clots will form in the veins. This makes them swollen and painful. These are called thrombosed hemorrhoids. CAUSES Causes of hemorrhoids include:  Pregnancy. This increases the pressure in the hemorrhoidal veins.   Constipation.   Straining to have a bowel movement.   Obesity.   Heavy lifting or other activity that caused you to strain.  TREATMENT Most of the time hemorrhoids improve in 1 to 2 weeks. However, if symptoms do not seem to be getting better or if you have a lot of rectal bleeding, your caregiver may perform a procedure to help make the hemorrhoids get smaller or remove them completely.Possible treatments include:  Rubber band ligation. A rubber band is placed at the base of the hemorrhoid to cut off the circulation.   Sclerotherapy. A chemical is injected to shrink the hemorrhoid.   Infrared light therapy. Tools are used to burn the hemorrhoid.   Hemorrhoidectomy. This is surgical removal of the hemorrhoid.  HOME CARE INSTRUCTIONS   Increase fiber in your diet. Ask your caregiver about using fiber supplements.   Drink enough water and fluids to keep your urine clear or pale yellow.   Exercise regularly.   Go to the bathroom when you have the urge to have a bowel movement. Do not wait.   Avoid straining to have bowel movements.   Keep the anal area dry and clean.   Only take over-the-counter or prescription medicines for pain, discomfort, or fever as directed by your caregiver.  If your hemorrhoids are thrombosed:  Take warm sitz baths for 20 to 30 minutes, 3 to 4 times per day.   If the hemorrhoids are very tender and swollen, place ice packs on the area as tolerated. Using ice packs between sitz baths may be helpful. Fill a plastic bag with ice. Place a towel between the bag of ice and your skin.   Medicated creams and suppositories may be used or applied as directed.   Do not use a donut-shaped pillow or sit on the toilet for long periods. This  increases blood pooling and pain.  SEEK MEDICAL CARE IF:   You have increasing pain and swelling that is not controlled with your medicine.   You have uncontrolled bleeding.   You have difficulty or you are unable to have a bowel movement.   You have pain or inflammation outside the area of the hemorrhoids.   You have chills or an oral temperature above 102 F (38.9 C).  MAKE SURE YOU:   Understand these instructions.   Will watch your condition.   Will get help right away if you are not doing  well or get worse.  Document Released: 12/06/2000 Document Revised: 11/28/2011 Document Reviewed: 04/12/2008 [facility] Patient Information 2012 [place], [place].

## 2012-04-13 NOTE — Op Note (Signed)
[facility] [address] [place], [place]  [address]  COLONOSCOPY PROCEDURE REPORT  PATIENT:  Tammy Webster, Tammy Webster  MR#:  [id number] BIRTHDATE:  1961-07-26, 50 yrs. old  GENDER:  female ENDOSCOPIST:  R. Roetta Sessions, MD FACP [facility] REF. BY:  Karleen Hampshire, M.D. PROCEDURE DATE:  04/13/2012 PROCEDURE:  Diagnostic ileocolonoscopy INDICATIONS:  Hematochezia  INFORMED CONSENT:  The risks, benefits, alternatives and imponderables including but not limited to bleeding, perforation as well as the possibility of a missed lesion have been reviewed. The potential for biopsy, lesion removal, etc. have also been discussed.  Questions have been answered.  All parties agreeable. Please see the history and physical in the medical record for more information.  MEDICATIONS:  Versed 6 mg IV and Demerol 100 mg in divided doses.  DESCRIPTION OF PROCEDURE:  After a digital rectal exam was performed, the EC-[id number] ([id number]) colonoscope was advanced from the anus through the rectum and colon to the area of the cecum, ileocecal valve and appendiceal orifice.  The cecum was deeply intubated.  These structures were well-seen and photographed for the record.  From the level of the cecum and ileocecal valve, the scope was slowly and cautiously withdrawn.  The mucosal surfaces were carefully surveyed utilizing scope tip deflection to facilitate fold flattening as needed.  The scope was pulled down into the rectum where a thorough examination including retroflexion was performed. <<PROCEDUREIMAGES>>  FINDINGS: Adequate preparation. Anal canal hemorrhoids and anal papilla. Normal rectum. Colon. Normal distal 10 cm of terminal ileal mucosa  THERAPEUTIC / DIAGNOSTIC MANEUVERS PERFORMED: None  COMPLICATIONS:  None  CECAL WITHDRAWAL TIME: 12 minutes  IMPRESSION: Anal canal hemorrhoids; normal rectum, colon and terminal ileum  RECOMMENDATIONS:  Add daily Benefiber to Senokot. Course of  Anusol suppositories as directed. See EGD report.  ______________________________ R. Roetta Sessions, MD Caleen Essex  CC:  Karleen Hampshire, M.D.  n. eSIGNED:   R. Casimiro Needle Amerah Puleo at 04/13/2012 01:29 PM  Belva Agee, [phone]

## 2012-04-15 ENCOUNTER — Encounter (HOSPITAL_COMMUNITY): Payer: Self-pay | Admitting: Internal Medicine

## 2012-04-16 ENCOUNTER — Other Ambulatory Visit (HOSPITAL_COMMUNITY)
Admission: RE | Admit: 2012-04-16 | Discharge: 2012-04-16 | Disposition: A | Discharge status: Home / Self Care | Payer: Managed Care, Other (non HMO) | Source: Ambulatory Visit | Attending: Obstetrics & Gynecology | Admitting: Obstetrics & Gynecology

## 2012-04-16 ENCOUNTER — Other Ambulatory Visit: Payer: Self-pay | Admitting: Obstetrics & Gynecology

## 2012-04-16 DIAGNOSIS — Z01419 Encounter for gynecological examination (general) (routine) without abnormal findings: Secondary | ICD-10-CM | POA: Insufficient documentation

## 2012-04-18 ENCOUNTER — Encounter: Payer: Self-pay | Admitting: Internal Medicine

## 2012-04-21 ENCOUNTER — Encounter: Payer: Self-pay | Admitting: Internal Medicine

## 2012-04-21 NOTE — Progress Notes (Signed)
Letter from: Corbin Ade Reason for Letter: Results Review Send letter to patient.  Send copy of letter with path to referring provider and PCP.    To Raynelle Fanning: prevpak or generic equivalent x 14 days; no refills      Pt aware, rx called to [facility].

## 2012-06-04 ENCOUNTER — Other Ambulatory Visit (HOSPITAL_COMMUNITY): Payer: Self-pay | Admitting: Internal Medicine

## 2012-06-04 ENCOUNTER — Ambulatory Visit (HOSPITAL_COMMUNITY)
Admission: RE | Admit: 2012-06-04 | Discharge: 2012-06-04 | Disposition: A | Discharge status: Home / Self Care | Payer: Managed Care, Other (non HMO) | Source: Ambulatory Visit | Attending: Internal Medicine | Admitting: Internal Medicine

## 2012-06-04 DIAGNOSIS — M79609 Pain in unspecified limb: Secondary | ICD-10-CM | POA: Insufficient documentation

## 2012-06-04 DIAGNOSIS — M7989 Other specified soft tissue disorders: Secondary | ICD-10-CM | POA: Insufficient documentation

## 2012-06-04 DIAGNOSIS — R609 Edema, unspecified: Secondary | ICD-10-CM

## 2012-06-24 ENCOUNTER — Encounter: Payer: Self-pay | Admitting: Internal Medicine

## 2012-06-29 ENCOUNTER — Ambulatory Visit (INDEPENDENT_AMBULATORY_CARE_PROVIDER_SITE_OTHER): Payer: Managed Care, Other (non HMO) | Admitting: Urgent Care

## 2012-06-29 ENCOUNTER — Encounter: Payer: Self-pay | Admitting: Urgent Care

## 2012-06-29 VITALS — BP 119/89 | HR 66 | Temp 97.9°F | Ht 65.5 in | Wt 214.6 lb

## 2012-06-29 DIAGNOSIS — B9681 Helicobacter pylori [H. pylori] as the cause of diseases classified elsewhere: Secondary | ICD-10-CM

## 2012-06-29 DIAGNOSIS — K2970 Gastritis, unspecified, without bleeding: Secondary | ICD-10-CM | POA: Insufficient documentation

## 2012-06-29 DIAGNOSIS — A048 Other specified bacterial intestinal infections: Secondary | ICD-10-CM

## 2012-06-29 DIAGNOSIS — K649 Unspecified hemorrhoids: Secondary | ICD-10-CM

## 2012-06-29 NOTE — Progress Notes (Signed)
Faxed to PCP

## 2012-06-29 NOTE — Assessment & Plan Note (Addendum)
S/p prevpac.  Given father's hx of gastric ca & the fact that pt has been on multiple antibiotics for MRSA over the past few years, will check h pylori stool antigen for eradication.  Symptoms significantly improved post treatment.  Do not take omeprazole for 2 weeks H pylori stool antigen At that time, resume omeprazole Call if any problems Office visit in 1 yr or sooner if needed

## 2012-06-29 NOTE — Progress Notes (Signed)
Primary Care Physician:  Kirk Ruths, MD Primary Gastroenterologist:  Dr. Jena Gauss  Chief Complaint  Patient presents with  . Follow-up    HPI:  Tammy Webster is a 51 y.o. female here for follow up for h pylori gastritis.  She had colonoscopy & EGD 03/2012 & was found to have h pylori gastritis & was started on a prevpac & completed.  Father has hx gastric ca.  She continues to have a gnawing epigastric pain 2-3 times per week.  It is not as severe as before.  There is no particular time of day or association w/eating.  Pain 2/10 at worst.  Much better than April prior to h pylori treatment.  Her bowels are much better.  SHe has had some hemorrhoids w/ scant bleeding, but did not use suppositories given by Dr Jena Gauss yet.  Appetite ok.  Dysphagia resolved.    Past Medical History  Diagnosis Date  . Chronic constipation   . Chest pain   . GERD (gastroesophageal reflux disease)   . MRSA infection 2007, 2008, 2009  . Helicobacter pylori gastritis 04/13/12    s/p prevpac    Past Surgical History  Procedure Date  . Carpal tunnel release     left, 2011. right, 03/09/12  . Gallbladder surgery 2004  . Tubal ligation   . Savory dilation 04/13/2012  . Maloney dilation 04/13/2012    32F  . Esophagogastroduodenoscopy 04/13/12    Rourk-normal esophaus s/p dilation/h pylori gastritis  . Colonoscopy 04/13/12    Rourk-anal canal hemorrhoids/normal colon,rectum    Current Outpatient Prescriptions  Medication Sig Dispense Refill  . acetaminophen (TYLENOL) 325 MG tablet Take 650 mg by mouth every 6 (six) hours as needed.      Marland Kitchen LORazepam (ATIVAN) 1 MG tablet Take 1 mg by mouth every 8 (eight) hours as needed.      . nitroGLYCERIN (NITROSTAT) 0.4 MG SL tablet Place 0.4 mg under the tongue once as needed. For chest pain      . omeprazole (PRILOSEC) 20 MG capsule Take 1 capsule (20 mg total) by mouth daily before breakfast.  30 capsule  11  . senna (SENOKOT) 8.6 MG tablet Take 1 tablet by mouth daily.         Allergies as of 06/29/2012  . (No Known Allergies)    Review of Systems: Gen: Denies any fever, chills, sweats, anorexia, fatigue, weakness, malaise, weight loss, and sleep disorder. CV: Denies chest pain, angina, palpitations, syncope, orthopnea, PND, peripheral edema, and claudication. Resp: Denies dyspnea at rest, dyspnea with exercise, cough, sputum, wheezing, coughing up blood, and pleurisy. GI: Denies vomiting blood, jaundice, and fecal incontinence.   Denies dysphagia or odynophagia. Derm: Denies rash, itching, dry skin, hives, moles, warts, or unhealing ulcers.  Psych: Denies depression, anxiety, memory loss, suicidal ideation, hallucinations, paranoia, and confusion. Heme: Denies bruising, bleeding, and enlarged lymph nodes.  Physical Exam: BP 119/89  Pulse 66  Temp 97.9 F (36.6 C) (Temporal)  Ht 5' 5.5" (1.664 m)  Wt 214 lb 9.6 oz (97.342 kg)  BMI 35.17 kg/m2 General:   Alert,  Well-developed, well-nourished, pleasant and cooperative in NAD Eyes:  Sclera clear, no icterus.   Conjunctiva pink. Mouth:  No deformity or lesions, oropharynx pink and moist. Neck:  Supple; no masses or thyromegaly. Heart:  Regular rate and rhythm; no murmurs, clicks, rubs,  or gallops. Abdomen:  Normal bowel sounds.  No bruits.  Soft, non-tender and non-distended without masses, hepatosplenomegaly or hernias noted.  No guarding  or rebound tenderness.   Rectal:  Deferred. Msk:  Symmetrical without gross deformities.  Pulses:  Normal pulses noted. Extremities:  No clubbing or edema. Neurologic:  Alert and oriented x4;  grossly normal neurologically. Skin:  Intact without significant lesions or rashes.

## 2012-06-29 NOTE — Patient Instructions (Addendum)
Do not take omeprazole for 2 weeks Then, obtain stool specimen for h pylori & return to the lab At that time, you may resume omeprazole Complete anusol for hemorrhoids Call if any problems Office visit in 1 yr or sooner if needed

## 2012-06-29 NOTE — Assessment & Plan Note (Signed)
Complete anusol for hemorrhoids

## 2012-07-21 ENCOUNTER — Other Ambulatory Visit: Payer: Self-pay | Admitting: Urgent Care

## 2012-07-22 LAB — HELICOBACTER PYLORI  SPECIAL ANTIGEN

## 2012-07-23 NOTE — Progress Notes (Signed)
Faxed to PCP

## 2012-07-23 NOTE — Progress Notes (Signed)
Quick Note:  Please let t know-H pylori has been treated. Test negative. Thanks ZO:XWRUEAV,[id number] M, MD  ______

## 2012-07-23 NOTE — Progress Notes (Signed)
Quick Note:  Tried to call pt- LMOM Please cc pcp ______

## 2012-07-23 NOTE — Progress Notes (Signed)
Quick Note:  Pt is aware. ______ 

## 2012-08-11 NOTE — Progress Notes (Signed)
H pylori stool Ag neg  REVIEWED.

## 2012-11-03 ENCOUNTER — Ambulatory Visit (HOSPITAL_COMMUNITY)
Admission: RE | Admit: 2012-11-03 | Discharge: 2012-11-03 | Disposition: A | Discharge status: Home / Self Care | Payer: Managed Care, Other (non HMO) | Source: Ambulatory Visit | Attending: Orthopaedic Surgery | Admitting: Orthopaedic Surgery

## 2012-11-03 DIAGNOSIS — M545 Low back pain, unspecified: Secondary | ICD-10-CM | POA: Insufficient documentation

## 2012-11-03 DIAGNOSIS — IMO0001 Reserved for inherently not codable concepts without codable children: Secondary | ICD-10-CM | POA: Insufficient documentation

## 2012-11-03 DIAGNOSIS — M25561 Pain in right knee: Secondary | ICD-10-CM | POA: Insufficient documentation

## 2012-11-03 DIAGNOSIS — M25569 Pain in unspecified knee: Secondary | ICD-10-CM | POA: Insufficient documentation

## 2012-11-03 NOTE — Evaluation (Signed)
Physical Therapy Evaluation  Patient Details  Name: Tammy Webster MRN: [id number] Date of Birth: 12/20/1961  Today's Date: 11/03/2012 Time: 4098-1191 PT Time Calculation (min): 39 min Charges: 1 eval, 10' TE Visit#: 1  of 12   Re-eval: 12/03/12 Assessment Diagnosis: Low Back Pain  Next MD Visit: Dr. Ophelia Charter- 11/24/12  Past Medical History:  Past Medical History  Diagnosis Date  . Chronic constipation   . Chest pain   . GERD (gastroesophageal reflux disease)   . MRSA infection 2007, 2008, 2009  . Helicobacter pylori gastritis 04/13/12    s/p prevpac   Past Surgical History:  Past Surgical History  Procedure Date  . Carpal tunnel release     left, 2011. right, 03/09/12  . Gallbladder surgery 2004  . Tubal ligation   . Savory dilation 04/13/2012  . Maloney dilation 04/13/2012    14F  . Esophagogastroduodenoscopy 04/13/12    Rourk-normal esophaus s/p dilation/h pylori gastritis  . Colonoscopy 04/13/12    Rourk-anal canal hemorrhoids/normal colon,rectum    Subjective Symptoms/Limitations Pertinent History: Pt is referred to PT for LBP and R L leg pain.  She reports that she has increased pain to her R low back, R hip, R knee and ankle which feels like a dull pain to her knee.  She states that it was intermittent and for the past few months it has been more constant, especially with sweeping.  How long can you sit comfortably?: no more than 60 minutes.  How long can you stand comfortably?: no difficulty Patient Stated Goals: "I want to be able sit more comfortably, be able to sweep, I want to be able to get my knee better so I can ride on my elliptical" Pain Assessment Currently in Pain?: Yes Pain Score:   5 (Pain Range: 0-7/10.  Avg: 4-5) Pain Relieving Factors: Alleve, Tyleonol.    Prior Functioning  Home Living Lives With: Spouse Prior Function Driving: Yes Vocation: Full time employment Vocation Requirements: [employer] child [employer].  5-6 days, 8 hour days.   Works second shift Comments: She enjoys working out on her elliptical, cleaning her home and working.  Sensation/Coordination/Flexibility/Functional Tests Coordination Gross Motor Movements are Fluid and Coordinated: No Coordination and Movement Description: difficulty coordinating core musculature and requires max cueing.  Functional Tests Functional Tests: ODI: 30%  Assessment RLE AROM (degrees) Right Knee Flexion: 122  (increased pain at end range) RLE Strength Right Hip Flexion: 3+/5 Right Hip Extension: 3+/5 Right Hip ABduction: 3+/5 Right Knee Flexion: 4/5 Right Knee Extension: 4/5 Lumbar AROM Lumbar Flexion: WNL  Lumbar Extension: decreased by 50% Lumbar - Right Side Bend: increased pain - WNL Lumbar - Left Side Bend: increased pain WNL Palpation Palpation: increased pain to medial and lateral joint line of R knee.  Significant pain and tenderness with muscular spams to L5 R multifidus region  Mobility/Balance  Ambulation/Gait Ambulation/Gait: Yes Gait Pattern: Antalgic;Lateral hip instability Posture/Postural Control Posture/Postural Control: Postural limitations Postural Limitations: slouched posture   Exercise/Treatments Supine Ab Set: 5 reps;Limitations AB Set Limitations: manual faciliation 10 sec holds Bridge: 10 reps Sidelying Hip Abduction: 10 reps Prone  Other Prone Lumbar Exercises: NMR for multifidus activitaition w/visual, VC and TC 3x10 sec holds after Other Prone Lumbar Exercises: PFC 3x10 sec holds  Physical Therapy Assessment and Plan PT Assessment and Plan Clinical Impression Statement: Pt is a 51 year female referred to PT for LBP w/radicular symptoms to RLE and R knee pain. Pt will benefit from skilled therapeutic intervention in  order to improve on the following deficits: Pain;Improper body mechanics;Decreased strength;Decreased range of motion;Decreased coordination Rehab Potential: Good PT Frequency: Min 3X/week PT Duration: 4 weeks PT  Treatment/Interventions: Gait training;Stair training;Functional mobility training;Therapeutic activities;Therapeutic exercise;Neuromuscular re-education;Patient/family education;Manual techniques;Modalities PT Plan: Continue with core strengthening, and R LE strenthening w/encouragement to engage core and improve posture (squats, heel and toe raises, stair training (pain free)).  Open chain activities and encourage core activiation.  manual tehcniques to decrease pain to knee and low back.      Goals PT Short Term Goals Time to Complete Short Term Goals: 2 weeks PT Short Term Goal 1: Pt will report pain in her back and knee less than 3/10 for improved QOL.  PT Short Term Goal 2: Pt will improve R LE strength to WNL.  PT Short Term Goal 3: Pt will report decrease in radicular pain to R LE by 75% PT Short Term Goal 4: Pt will improve supine R knee flexion to 130 degrees without pain at end range.  PT Long Term Goals Time to Complete Long Term Goals: 4 weeks PT Long Term Goal 1: Pt will report decreased pain to low back and right knee in order to tolerate using her elliptical to continue with work out activities.  PT Long Term Goal 2: Pt will improve body mechanics in order to sweep her floor with decreased pain.  Long Term Goal 3: Pt will decrease her NDI to less than 22% for improved QOL.  Long Term Goal 4: Pt will improve core strength in order to present with normalized posture with min cueing for an entire therapy session.   Problem List Patient Active Problem List  Diagnosis  . Epigastric pain  . GERD (gastroesophageal reflux disease)  . Constipation, chronic  . Esophageal dysphagia  . Rectal bleeding  . Helicobacter pylori gastritis  . Hemorrhoids  . Right knee pain  . Low back pain    PT Plan of Care PT Home Exercise Plan: see scanned document.  PT Patient Instructions: Discussed importance of posture.  Discussed NS and Cx policy Consulted and Agree with Plan of Care:  Patient  Annett Fabian, PT 11/03/2012, 4:28 PM  Physician Documentation Your signature is required to indicate approval of the treatment plan as stated above.  Please sign and either send electronically or make a copy of this report for your files and return this physician signed original.   Please mark one 1.__approve of plan  2. ___approve of plan with the following conditions.   ______________________________                                                          _____________________ Physician Signature  Date  

## 2012-11-06 ENCOUNTER — Ambulatory Visit (HOSPITAL_COMMUNITY)
Admission: RE | Admit: 2012-11-06 | Discharge: 2012-11-06 | Disposition: A | Discharge status: Home / Self Care | Payer: Managed Care, Other (non HMO) | Source: Ambulatory Visit | Attending: Orthopaedic Surgery | Admitting: Orthopaedic Surgery

## 2012-11-06 NOTE — Progress Notes (Signed)
Physical Therapy Treatment Patient Details  Name: Tammy Webster MRN: [id number] Date of Birth: Jul 07, 1961  Today's Date: 11/06/2012 Time: 1350-1430 PT Time Calculation (min): 40 min  Visit#: 2  of 12   Re-eval: 12/03/12  Charge: therex 30', manual 8'  Subjective: Symptoms/Limitations Symptoms: Pt stated pain scale 3/10 Lower right back.  Pt stated compliance with HEP, no questions. Pain Assessment Currently in Pain?: Yes Pain Score:   3 Pain Location: Back Pain Orientation: Lower;Right  Objective:   Exercise/Treatments Standing Heel Raises: 10 reps;Limitations Heel Raises Limitations: toe raises 10 Functional Squats: 10 reps;3 seconds;Limitations Functional Squats Limitations: cueing for proper form Other Standing Lumbar Exercises: stair training step up, lateral and step down 4in step 10x with intermittent HHA Supine Ab Set: 5 reps;5 seconds;Limitations AB Set Limitations: manual faciliation 10 sec holds; activated with supine exercises Bridge: 15 reps Straight Leg Raise: 10 reps;Limitations Straight Leg Raises Limitations: BLE Other Supine Lumbar Exercises: patella mobs R/L, A/P and tib/fib x 8' Sidelying Clam: 10 reps;Limitations Clam Limitations: manual facilitation 10"holds Hip Abduction: 10 reps  Manual Therapy Manual Therapy: Joint mobilization Joint Mobilization: patella mobs R/L, A/P and tib/fib x 8'  Physical Therapy Assessment and Plan PT Assessment and Plan Clinical Impression Statement: Began treatment per PT plan for core and R LE strengthening and pain reduction with manual techniques.  Pt required min-mod vc-ing for proper form/technique with functional squats, able to demonstrate appropriately following cues.  Weak core musculature noted, pt unable to hold TrA contractions for10 sec holds.  Neuromusculature education complete for glut med strengthening with tactile cueing to reduce h/s contraction.  Pt stated pain R knee reduced to 1-2/10 at end of  session following manual patella mobs and tib/fib. PT Plan: Continue with core and R LE strengthening with encouragement to engage core and improve posture.  Manual techniques to reduce pain to knee and low back.    Goals    Problem List Patient Active Problem List  Diagnosis  . Epigastric pain  . GERD (gastroesophageal reflux disease)  . Constipation, chronic  . Esophageal dysphagia  . Rectal bleeding  . Helicobacter pylori gastritis  . Hemorrhoids  . Right knee pain  . Low back pain    PT - End of Session Activity Tolerance: Patient tolerated treatment well General Behavior During Session: [facility] for tasks performed Cognition: [facility] for tasks performed  GP    Juel Burrow 11/06/2012, 2:39 PM

## 2012-11-10 ENCOUNTER — Ambulatory Visit (HOSPITAL_COMMUNITY): Payer: Managed Care, Other (non HMO) | Admitting: Physical Therapy

## 2012-11-11 ENCOUNTER — Ambulatory Visit (HOSPITAL_COMMUNITY)
Admission: RE | Admit: 2012-11-11 | Discharge: 2012-11-11 | Disposition: A | Discharge status: Home / Self Care | Payer: Managed Care, Other (non HMO) | Source: Ambulatory Visit | Attending: Orthopaedic Surgery | Admitting: Orthopaedic Surgery

## 2012-11-11 NOTE — Progress Notes (Signed)
Physical Therapy Treatment Patient Details  Name: Tammy Webster MRN: [id number] Date of Birth: 1961/12/03  Today's Date: 11/11/2012 Time: 2952-8413 PT Time Calculation (min): 46 min  Visit#: 3  of 12   Re-eval: 12/03/12 Assessment Diagnosis: Low Back Pain  Next MD Visit: Dr. Ophelia Charter- 11/24/12 Charge: therex 38', self care 8'  Subjective: Symptoms/Limitations Symptoms: Pain scale 4/10 R knee with increased swelling, lower back is feeling better. Pain Assessment Currently in Pain?: Yes Pain Score:   4 Pain Location: Knee Pain Orientation: Right  Objective:   Exercise/Treatments Standing Heel Raises: 15 reps Heel Raises Limitations: toe raises 15 Functional Squats: 10 reps;3 seconds;Limitations Functional Squats Limitations: cueing for proper form Other Standing Lumbar Exercises: rocker board R/L x 2 min; stair training step up x 15, lateral 5x with c/o increased R knee pain stopped following  Other Standing Lumbar Exercises: Posture cueing while standing with tactile cueing top of head to activate TrA for core strengthening Seated Other Seated Lumbar Exercises: TrA and Multifidus seated with tactile cueing for correct musculature activation Other Seated Lumbar Exercises: heel and toe roll outs 10x 10" Supine Bridge: 15 reps Straight Leg Raise: 15 reps;Limitations Straight Leg Raises Limitations: BLE Other Supine Lumbar Exercises: patella mobs R/L, A/P and tib/fib x 8'  Physical Therapy Assessment and Plan PT Assessment and Plan Clinical Impression Statement: Added seated posture strengthening and hip mobility exercises, pt educated on ways to incorporate HEP into daily tasks without having to take time out of day for exercises specially though encouraged to if she can.  Pt reported increased R knee pain with lateral step ups so stopping for this session, resume next session without increase in pain.  Joint mobs complete to reduce pain, pt stated no change in pain scale at end  of session.  Pt stated she would ice at home. PT Plan: Continue with core and R LE strengthening with encouragement to engage core and improve posture. Manual techniques to reduce pain to knee and low back.    Goals    Problem List Patient Active Problem List  Diagnosis  . Epigastric pain  . GERD (gastroesophageal reflux disease)  . Constipation, chronic  . Esophageal dysphagia  . Rectal bleeding  . Helicobacter pylori gastritis  . Hemorrhoids  . Right knee pain  . Low back pain    PT - End of Session Activity Tolerance: Patient tolerated treatment well General Behavior During Session: [facility] for tasks performed Cognition: [facility] for tasks performed  GP    Juel Burrow 11/11/2012, 6:42 PM

## 2012-11-13 ENCOUNTER — Ambulatory Visit (HOSPITAL_COMMUNITY): Payer: Managed Care, Other (non HMO) | Admitting: Physical Therapy

## 2012-11-17 ENCOUNTER — Ambulatory Visit (HOSPITAL_COMMUNITY)
Admission: RE | Admit: 2012-11-17 | Discharge: 2012-11-17 | Disposition: A | Discharge status: Home / Self Care | Payer: Managed Care, Other (non HMO) | Source: Ambulatory Visit | Attending: Orthopaedic Surgery | Admitting: Orthopaedic Surgery

## 2012-11-17 NOTE — Progress Notes (Signed)
Physical Therapy Treatment Patient Details  Name: Tammy Webster MRN: [id number] Date of Birth: [birth year]  Today's Date: 11/17/2012 Time: 1352-1430 PT Time Calculation (min): 38 min  Visit#: 4  of 12   Re-eval: 12/03/12 (Dr Ophelia Charter MD Apt 11/24/2012) Assessment Diagnosis: Low Back Pain  Next MD Visit: Dr. Ophelia Charter- 11/24/12 Charge: Therex 38'  Subjective: Symptoms/Limitations Symptoms: Knee edema reduced but R knee pain around 4/10 today, slight pain in lower back as well pain scale 4/10. Pain Assessment Currently in Pain?: Yes Pain Score:   4 Pain Location: Back Pain Orientation: Right;Lower Multiple Pain Sites: Yes  Objective:  Exercise/Treatments Stretches Active Hamstring Stretch: 2 reps;30 seconds Standing Heel Raises: 15 reps Heel Raises Limitations: toe raises 15 Functional Squats: 15 reps;3 seconds Functional Squats Limitations: improved mechanics Other Standing Lumbar Exercises: rocker board R/L x 3 min no HHA; stair training all directions x 15 on 4 in step Other Standing Lumbar Exercises: gastroc st 2x 30" Supine Straight Leg Raise: 15 reps;Limitations Straight Leg Raises Limitations: BLE Isometric Hip Flexion: 5 reps;5 seconds;Limitations Isometric Hip Flexion Limitations: BLE Sidelying Clam: 5 reps;Limitations Clam Limitations: manual facilitation 10"holds Hip Abduction: 10 reps  Manual Therapy Manual Therapy: Joint mobilization Joint Mobilization: patella mobs R/L, A/P and tib/fib x 8'   Physical Therapy Assessment and Plan PT Assessment and Plan Clinical Impression Statement: Pt improving form with therex, less cueing required for technique with mat activities and functional squats this session.  Decrease activity tolerance noted with some activities, requiring rest breaks to complete task.  Added isometric hip flexion to POC to improve strength without difficulty.  Pt reported pain reduced at end of session following joint mobs to R knee. PT Plan:  Continue with core and R LE strengthening with encouragement to engage core and improve posture. Manual techniques to reduce pain to knee and low back    Goals    Problem List Patient Active Problem List  Diagnosis  . Epigastric pain  . GERD (gastroesophageal reflux disease)  . Constipation, chronic  . Esophageal dysphagia  . Rectal bleeding  . Helicobacter pylori gastritis  . Hemorrhoids  . Right knee pain  . Low back pain    PT - End of Session Activity Tolerance: Patient tolerated treatment well General Behavior During Session: [facility] for tasks performed Cognition: [facility] for tasks performed  GP    Juel Burrow 11/17/2012, 3:37 PM

## 2012-11-24 ENCOUNTER — Inpatient Hospital Stay (HOSPITAL_COMMUNITY): Admission: RE | Admit: 2012-11-24 | Payer: Managed Care, Other (non HMO) | Source: Ambulatory Visit

## 2013-02-22 ENCOUNTER — Other Ambulatory Visit: Payer: Self-pay | Admitting: Orthopaedic Surgery

## 2013-02-22 DIAGNOSIS — M545 Low back pain, unspecified: Secondary | ICD-10-CM

## 2013-03-04 ENCOUNTER — Ambulatory Visit
Admission: RE | Admit: 2013-03-04 | Discharge: 2013-03-04 | Disposition: A | Discharge status: Home / Self Care | Payer: BC Managed Care – PPO | Source: Ambulatory Visit | Attending: Orthopaedic Surgery | Admitting: Orthopaedic Surgery

## 2013-03-04 DIAGNOSIS — M545 Low back pain, unspecified: Secondary | ICD-10-CM

## 2014-01-25 ENCOUNTER — Telehealth: Payer: Self-pay | Admitting: *Deleted

## 2014-01-26 ENCOUNTER — Encounter: Payer: Self-pay | Admitting: Internal Medicine

## 2014-01-26 ENCOUNTER — Ambulatory Visit (INDEPENDENT_AMBULATORY_CARE_PROVIDER_SITE_OTHER): Payer: BC Managed Care – PPO | Admitting: Internal Medicine

## 2014-01-26 VITALS — BP 132/80 | HR 80 | Ht 65.0 in | Wt 207.7 lb

## 2014-01-26 DIAGNOSIS — I517 Cardiomegaly: Secondary | ICD-10-CM

## 2014-01-26 DIAGNOSIS — R0602 Shortness of breath: Secondary | ICD-10-CM

## 2014-01-26 DIAGNOSIS — R0989 Other specified symptoms and signs involving the circulatory and respiratory systems: Secondary | ICD-10-CM

## 2014-01-26 DIAGNOSIS — F17200 Nicotine dependence, unspecified, uncomplicated: Secondary | ICD-10-CM

## 2014-01-26 DIAGNOSIS — Z13220 Encounter for screening for lipoid disorders: Secondary | ICD-10-CM

## 2014-01-26 DIAGNOSIS — Z01810 Encounter for preprocedural cardiovascular examination: Secondary | ICD-10-CM

## 2014-01-26 DIAGNOSIS — R0609 Other forms of dyspnea: Secondary | ICD-10-CM

## 2014-01-26 NOTE — Patient Instructions (Signed)
Your physician has requested that you have an echocardiogram. Echocardiography is a painless test that uses sound waves to create images of your heart. It provides your doctor with information about the size and shape of your heart and how well your heart's chambers and valves are working. This procedure takes approximately one hour. There are no restrictions for this procedure.  Your physician has requested that you have a lexiscan myoview. For further information please visit [email or web address]. Please follow instruction sheet, as given.  Your physician recommends that you return for lab work at your earliest convenience, but prior to your next office visit. You will need to be fasting.  Your physician recommends that you schedule a follow-up appointment in a few weeks, after your stress tests.

## 2014-01-26 NOTE — Progress Notes (Signed)
OFFICE NOTE  Chief Complaint:  "enlarged heart", DOE, cough  Primary Care Physician: Kirk Ruths, MD  HPI:  Tammy Webster is a pleasant 53 year old female who is referred to me for evaluation of cardio megaly.  Since around Christmas she was having trouble with flulike symptoms and has had persistent cough and shortness of breath since that time. She had a chest x-ray a few weeks ago which demonstrated no significant lung disease however there was cardiomegaly. A review of an earlier abdominal CT scan did show evidence for cardiomegaly during that study. She does however report worsening shortness of breath since Christmas. She denies any orthopnea, PND or weight gain and swelling. Her cough is nonproductive but has not improved. She also denies any chest pain.  There is a strong family history of heart disease in her father and all of her grandparents.  She is also contemplating an upcoming lumbar fusion surgery in March by Dr. Ophelia Charter.  PMHx:  Past Medical History  Diagnosis Date  . Chronic constipation   . Chest pain   . GERD (gastroesophageal reflux disease)   . MRSA infection 2007, 20082009/05/30  . Helicobacter pylori gastritis 04/13/12    s/p prevpac    Past Surgical History  Procedure Laterality Date  . Carpal tunnel release      left, 2010-05-21. right, 03/09/12  . Gallbladder surgery  05/22/03  . Tubal ligation    . Savory dilation  04/13/2012  . Maloney dilation  04/13/2012    10F  . Esophagogastroduodenoscopy  04/13/12    Rourk-normal esophaus s/p dilation/h pylori gastritis  . Colonoscopy  04/13/12    Rourk-anal canal hemorrhoids/normal colon,rectum    FAMHx:  Family History  Problem Relation Age of Onset  . Colon polyps Father     heart disease - stenting, "shocked heart twice"  . Gastric cancer Father     deceased 05-21-12  . Liver disease Neg Hx   . Colon cancer Neg Hx   . Heart disease Maternal Grandmother 45  . Breast cancer Maternal Grandmother 103  . Heart  disease Maternal Grandfather 60    also MI  . Heart disease Paternal Grandmother 39  . Diabetes Paternal Grandmother   . Heart disease Paternal Grandfather 42  . Heart failure Paternal Grandfather     enlarged heart    SOCHx:   reports that she quit smoking about 3 years ago. Her smoking use included Cigarettes. She has a 60 pack-year smoking history. She has never used smokeless tobacco. She reports that she does not drink alcohol or use illicit drugs.  ALLERGIES:  No Known Allergies  ROS: A comprehensive review of systems was negative except for: Respiratory: positive for dyspnea on exertion Musculoskeletal: positive for back pain  HOME MEDS: Current Outpatient Prescriptions  Medication Sig Dispense Refill  . GuaiFENesin (MUCINEX PO) Take by mouth daily.      Marland Kitchen LORazepam (ATIVAN) 1 MG tablet Take 1 mg by mouth every 8 (eight) hours as needed.      Marland Kitchen omeprazole (PRILOSEC) 20 MG capsule Take 1 capsule (20 mg total) by mouth daily before breakfast.  30 capsule  11   No current facility-administered medications for this visit.    LABS/IMAGING: No results found for this or any previous visit (from the past 48 hour(s)). No results found.  VITALS: BP 132/80  Pulse 80  Ht 5\' 5"  (1.651 m)  Wt 207 lb 11.2 oz (94.212 kg)  BMI 34.56 kg/m2  EXAM: General  appearance: alert and no distress Neck: no carotid bruit and no JVD Lungs: diminished breath sounds bibasilar Heart: regular rate and rhythm, S1, S2 normal, no murmur, click, rub or gallop Abdomen: soft, non-tender; bowel sounds normal; no masses,  no organomegaly Extremities: extremities normal, atraumatic, no cyanosis or edema Pulses: 2+ and symmetric Skin: Skin color, texture, turgor normal. No rashes or lesions Neurologic: Grossly normal Psych: Normal  EKG: Normal sinus rhythm at 80  ASSESSMENT: 1. Dyspnea on exertion 2. Cardiomegaly 3. Indeterminate risk for surgery 4. Family history coronary  disease 5. Cough 6. Dyslipidemia  PLAN: 1.   Tammy Webster has been experiencing worsening shortness of breath and persistent, nonproductive cough. Her exam is not suggestive of overt heart failure, but there is cardiomegaly on her chest x-ray. This could represent a dilated cardiomyopathy, pericardial effusion or potentially ventricular hypertrophy. I would recommend an echocardiogram which will adequately evaluate for all of these diagnoses. I also feel that she should have stress testing prior to her back surgery given her shortness of breath and family history of coronary disease. Unfortunately with her back pain and limitation she cannot walk on a treadmill therefore would recommend a lexiscan nuclear stress test.  We'll see her back to discuss results of these studies in a few weeks.  Finally, she was told she has a history of abnormal cholesterol and thought she was going to be started on medication at one point but it was never ordered. Her last lipid profile was over one year ago I will go ahead and obtain another lipid profile today.  Thank you as always for referring her for cardiac and preoperative evaluation.  Chrystie NoseKenneth C. Ellisha Bankson, MD, [facility] IncorporatedFACC Attending Cardiologist [facility]  Naylah Cork C 01/26/2014, 4:12 PM

## 2014-01-27 ENCOUNTER — Encounter: Payer: Self-pay | Admitting: Internal Medicine

## 2014-02-03 ENCOUNTER — Ambulatory Visit (HOSPITAL_COMMUNITY)
Admission: RE | Admit: 2014-02-03 | Discharge: 2014-02-03 | Disposition: A | Discharge status: Home / Self Care | Payer: BC Managed Care – PPO | Source: Ambulatory Visit | Attending: Internal Medicine | Admitting: Internal Medicine

## 2014-02-03 DIAGNOSIS — R079 Chest pain, unspecified: Secondary | ICD-10-CM | POA: Insufficient documentation

## 2014-02-03 DIAGNOSIS — E669 Obesity, unspecified: Secondary | ICD-10-CM | POA: Insufficient documentation

## 2014-02-03 DIAGNOSIS — E785 Hyperlipidemia, unspecified: Secondary | ICD-10-CM | POA: Insufficient documentation

## 2014-02-03 DIAGNOSIS — R0989 Other specified symptoms and signs involving the circulatory and respiratory systems: Secondary | ICD-10-CM | POA: Insufficient documentation

## 2014-02-03 DIAGNOSIS — R002 Palpitations: Secondary | ICD-10-CM | POA: Insufficient documentation

## 2014-02-03 DIAGNOSIS — R42 Dizziness and giddiness: Secondary | ICD-10-CM | POA: Insufficient documentation

## 2014-02-03 DIAGNOSIS — R5381 Other malaise: Secondary | ICD-10-CM | POA: Insufficient documentation

## 2014-02-03 DIAGNOSIS — Z87891 Personal history of nicotine dependence: Secondary | ICD-10-CM | POA: Insufficient documentation

## 2014-02-03 DIAGNOSIS — Z01810 Encounter for preprocedural cardiovascular examination: Secondary | ICD-10-CM | POA: Insufficient documentation

## 2014-02-03 DIAGNOSIS — R0602 Shortness of breath: Secondary | ICD-10-CM

## 2014-02-03 DIAGNOSIS — Z8249 Family history of ischemic heart disease and other diseases of the circulatory system: Secondary | ICD-10-CM | POA: Insufficient documentation

## 2014-02-03 DIAGNOSIS — R0609 Other forms of dyspnea: Secondary | ICD-10-CM | POA: Insufficient documentation

## 2014-02-03 DIAGNOSIS — F17200 Nicotine dependence, unspecified, uncomplicated: Secondary | ICD-10-CM

## 2014-02-03 DIAGNOSIS — R5383 Other fatigue: Secondary | ICD-10-CM

## 2014-02-03 DIAGNOSIS — I517 Cardiomegaly: Secondary | ICD-10-CM

## 2014-02-03 LAB — NMR LIPOPROFILE WITH LIPIDS
Cholesterol, Total: 227 mg/dL — ABNORMAL HIGH (ref <200)
HDL Particle Number: 38.5 umol/L (ref >=30.5)
HDL SIZE: 9.8 nm (ref >=9.2)
HDL-C: 44 mg/dL (ref >=40)
LARGE HDL: 3.1 umol/L — AB (ref >=4.8)
LARGE VLDL-P: 12.9 nmol/L — AB (ref <=2.7)
LDL (calc): 129 mg/dL — ABNORMAL HIGH (ref <100)
LDL PARTICLE NUMBER: 1844 nmol/L — AB (ref <1000)
LDL SIZE: 19.6 nm — AB (ref >20.5)
LP-IR Score: 60 — ABNORMAL HIGH (ref <=45)
Small LDL Particle Number: 1197 nmol/L — ABNORMAL HIGH (ref <=527)
TRIGLYCERIDES: 269 mg/dL — AB (ref <150)
VLDL SIZE: 52.7 nm — AB (ref <=46.6)

## 2014-02-03 MED ORDER — TECHNETIUM TC 99M SESTAMIBI GENERIC - CARDIOLITE
30.7000 | Freq: Once | INTRAVENOUS | Status: AC | PRN
  Administered 2014-02-03: 30.7 via INTRAVENOUS

## 2014-02-03 MED ORDER — REGADENOSON 0.4 MG/5ML IV SOLN
0.4000 mg | Freq: Once | INTRAVENOUS | Status: AC
  Administered 2014-02-03: 0.4 mg via INTRAVENOUS

## 2014-02-03 MED ORDER — TECHNETIUM TC 99M SESTAMIBI GENERIC - CARDIOLITE
10.2000 | Freq: Once | INTRAVENOUS | Status: AC | PRN
  Administered 2014-02-03: 10 via INTRAVENOUS

## 2014-02-03 NOTE — Procedures (Addendum)
[facility] [address] [place] [id number] [phone]  Cardiology Nuclear Med Study  Tammy Webster is a 53 y.o. female     MRN : [id number]     DOB: 1961/11/03  Procedure Date: 02/03/2014  Nuclear Med Background Indication for Stress Test:  Surgical Clearance History:  cardiomegaly per chest x-ray Cardiac Risk Factors: Family History - CAD, History of Smoking, Lipids and Obesity  Symptoms:  Chest Pain, Dizziness, DOE, Fatigue, Light-Headedness and Palpitations   Nuclear Pre-Procedure Caffeine/Decaff Intake:  1:00am NPO After: 11am   IV Site: R Forearm  IV 0.9% NS with Angio Cath:  22g  Chest Size (in):  n/a IV Started by: Tammy PomfretAmanda Hicks, RN  Height: 5\' 5"  (1.651 m)  Cup Size: D  BMI:  Body mass index is 34.45 kg/(m^2). Weight:  207 lb (93.895 kg)   Tech Comments:  n/a    Nuclear Med Study 1 or 2 day study: 1 day  Stress Test Type:  Lexiscan  Order Authorizing Provider:  Zoila ShutterKenneth Neko Boyajian, MD   Resting Radionuclide: Technetium 564m Sestamibi  Resting Radionuclide Dose: 10.2 mCi   Stress Radionuclide:  Technetium 404m Sestamibi  Stress Radionuclide Dose: 30.7 mCi           Stress Protocol Rest HR: 82 Stress HR: 113  Rest BP: 126/79 Stress BP: 126/86  Exercise Time (min): n/a METS: n/a   Predicted Max HR: 179 bpm % Max HR: 63.13 bpm Rate Pressure Product: [id number]  Dose of Adenosine (mg):  n/a Dose of Lexiscan: 0.4 mg  Dose of Atropine (mg): n/a Dose of Dobutamine: n/a mcg/kg/min (at max HR)  Stress Test Technologist: Tammy Webster, CCT Nuclear Technologist: Tammy Webster, CNMT   Rest Procedure:  Myocardial perfusion imaging was performed at rest 45 minutes following the intravenous administration of Technetium 514m Sestamibi. Stress Procedure:  The patient received IV Lexiscan 0.4 mg over 15-seconds.  Technetium 284m Sestamibi injected at 30-seconds.  There were no significant changes with Lexiscan.  Quantitative  spect images were obtained after a 45 minute delay.  Transient Ischemic Dilatation (Normal <1.22):  1.18 Lung/Heart Ratio (Normal <0.45):  0.34 QGS EDV:  84 ml QGS ESV:  26 ml LV Ejection Fraction: 69%  Rest ECG: NSR - Normal EKG  Stress ECG: No significant change from baseline ECG  QPS Raw Data Images:  Normal; no motion artifact; normal heart/lung ratio. Stress Images:  There is decreased uptake in the anterior wall. Rest Images:  There is decreased uptake in the anterior wall. Subtraction (SDS):  There is a fixed anteriour defect that is most consistent with breast attenuation.  Impression Exercise Capacity:  Lexiscan with no exercise. BP Response:  Hypertensive blood pressure response. Clinical Symptoms:  No significant symptoms noted. ECG Impression:  No significant ECG changes with Lexiscan. Comparison with Prior Nuclear Study: No previous nuclear study performed  Overall Impression:  Low risk stress nuclear study with area of breast attenuation artifact, worse at rest than stress.  LV Wall Motion:  NL LV Function; NL Wall Motion; EF 69%.  Tammy Webster. Tammy Theriault, MD, [facility] HospitalFACC Board Certified in Nuclear Cardiology Attending Cardiologist [facility]  Tammy NoseHILTY,Tammy Staib C, MD  02/03/2014 4:47 PM

## 2014-02-07 ENCOUNTER — Ambulatory Visit: Payer: BC Managed Care – PPO | Admitting: Internal Medicine

## 2014-02-08 ENCOUNTER — Ambulatory Visit (HOSPITAL_COMMUNITY): Payer: BC Managed Care – PPO

## 2014-02-11 ENCOUNTER — Ambulatory Visit (INDEPENDENT_AMBULATORY_CARE_PROVIDER_SITE_OTHER): Payer: BC Managed Care – PPO | Admitting: Internal Medicine

## 2014-02-11 ENCOUNTER — Encounter: Payer: Self-pay | Admitting: Internal Medicine

## 2014-02-11 VITALS — BP 120/80 | HR 84 | Ht 65.5 in | Wt 213.8 lb

## 2014-02-11 DIAGNOSIS — I517 Cardiomegaly: Secondary | ICD-10-CM

## 2014-02-11 DIAGNOSIS — E785 Hyperlipidemia, unspecified: Secondary | ICD-10-CM | POA: Insufficient documentation

## 2014-02-11 DIAGNOSIS — R0989 Other specified symptoms and signs involving the circulatory and respiratory systems: Secondary | ICD-10-CM

## 2014-02-11 DIAGNOSIS — R0609 Other forms of dyspnea: Secondary | ICD-10-CM

## 2014-02-11 NOTE — Patient Instructions (Signed)
Your physician recommends that you schedule a follow-up appointment in: 6 Months  

## 2014-02-11 NOTE — Progress Notes (Signed)
OFFICE NOTE  Chief Complaint:  "enlarged heart", DOE, cough  Primary Care Physician: Kirk Ruths, MD  HPI:  Tammy Webster is a pleasant 53 year old female who is referred to me for evaluation of cardio megaly.  Since around Christmas she was having trouble with flulike symptoms and has had persistent cough and shortness of breath since that time. She had a chest x-ray a few weeks ago which demonstrated no significant lung disease however there was cardiomegaly. A review of an earlier abdominal CT scan did show evidence for cardiomegaly during that study. She does however report worsening shortness of breath since Christmas. She denies any orthopnea, PND or weight gain and swelling. Her cough is nonproductive but has not improved. She also denies any chest pain.  There is a strong family history of heart disease in her father and all of her grandparents.  She is also contemplating an upcoming lumbar fusion surgery in March by Dr. Ophelia Charter.  [name] returns today for followup of her stress test, echocardiogram and lipid profile.  Her stress test was essentially normal with no ischemia and normal chamber sizes. EF was preserved. This I think excludes a cardiomyopathy, but does not explain her cardiomegaly. Unfortunately her echocardiogram was scheduled on the day were the office was closed due to inclement weather. This will need to be rescheduled.  Finally she did receive a lipid profile which showed total cholesterol of 227, LDL 129, triglycerides 269 and LDL particle number of 1844.  This is obviously elevated.  PMHx:  Past Medical History  Diagnosis Date  . Chronic constipation   . Chest pain   . GERD (gastroesophageal reflux disease)   . MRSA infection 2007, 20082009-05-21  . Helicobacter pylori gastritis 04/13/12    s/p prevpac    Past Surgical History  Procedure Laterality Date  . Carpal tunnel release      left, May 12, 2010. right, 03/09/12  . Gallbladder surgery  2003-05-13  . Tubal  ligation    . Savory dilation  04/13/2012  . Maloney dilation  04/13/2012    75F  . Esophagogastroduodenoscopy  04/13/12    Rourk-normal esophaus s/p dilation/h pylori gastritis  . Colonoscopy  04/13/12    Rourk-anal canal hemorrhoids/normal colon,rectum    FAMHx:  Family History  Problem Relation Age of Onset  . Colon polyps Father     heart disease - stenting, "shocked heart twice"  . Gastric cancer Father     deceased 05-12-12  . Liver disease Neg Hx   . Colon cancer Neg Hx   . Heart disease Maternal Grandmother 45  . Breast cancer Maternal Grandmother 55  . Heart disease Maternal Grandfather 60    also MI  . Heart disease Paternal Grandmother 58  . Diabetes Paternal Grandmother   . Heart disease Paternal Grandfather 63  . Heart failure Paternal Grandfather     enlarged heart    SOCHx:   reports that she quit smoking about 3 years ago. Her smoking use included Cigarettes. She has a 60 pack-year smoking history. She has never used smokeless tobacco. She reports that she does not drink alcohol or use illicit drugs.  ALLERGIES:  No Known Allergies  ROS: A comprehensive review of systems was negative except for: Respiratory: positive for dyspnea on exertion Musculoskeletal: positive for back pain  HOME MEDS: Current Outpatient Prescriptions  Medication Sig Dispense Refill  . LORazepam (ATIVAN) 1 MG tablet Take 1 mg by mouth every 8 (eight) hours as needed.  No current facility-administered medications for this visit.    LABS/IMAGING: No results found for this or any previous visit (from the past 48 hour(s)). No results found.  VITALS: BP 120/80  Pulse 84  Ht 5' 5.5" (1.664 m)  Wt 213 lb 12.8 oz (96.979 kg)  BMI 35.02 kg/m2  EXAM: Deferred  EKG: deferred  ASSESSMENT: 1. Dyspnea on exertion - negative nuclear stress test 2. Cardiomegaly 3. Indeterminate risk for surgery 4. Family history coronary disease 5. Cough 6. Dyslipidemia  PLAN: 1.   Ms.  Zenda AlpersSawyer had a low risk nuclear stress test which was negative for ischemia. LV chamber volume and EF were normal, suggesting that this is not a dilated cardiomyopathy  as the cause of her cardiomegaly. Unfortunately, without the echocardiogram I cannot comment on whether she has left ventricular hypertrophy or even a pericardial effusion to explain her large cardiac silhouette. I would recommend that she follow through with her rescheduled echocardiogram and we will contact her with those results. Although her cholesterol is higher than I would like, I recommended changing her diet and exercise over the next 6 months to see if it improves. If there has not been significant progress, I would recommend statin therapy.  Thank you as always for referring her for cardiac and preoperative evaluation.  Based on her low risk stress test, she should be at low risk for surgery.  Chrystie NoseKenneth C. Marquette Blodgett, MD, [facility] HospitalFACC Attending Cardiologist [facility]  Menelik Mcfarren C 02/11/2014, 4:46 PM

## 2014-02-16 ENCOUNTER — Ambulatory Visit (HOSPITAL_COMMUNITY)
Admission: RE | Admit: 2014-02-16 | Discharge: 2014-02-16 | Disposition: A | Discharge status: Home / Self Care | Payer: BC Managed Care – PPO | Source: Ambulatory Visit | Attending: Internal Medicine | Admitting: Internal Medicine

## 2014-02-16 DIAGNOSIS — I517 Cardiomegaly: Secondary | ICD-10-CM | POA: Insufficient documentation

## 2014-02-16 DIAGNOSIS — R0602 Shortness of breath: Secondary | ICD-10-CM

## 2014-02-16 DIAGNOSIS — R0609 Other forms of dyspnea: Secondary | ICD-10-CM | POA: Insufficient documentation

## 2014-02-16 DIAGNOSIS — R0989 Other specified symptoms and signs involving the circulatory and respiratory systems: Principal | ICD-10-CM | POA: Insufficient documentation

## 2014-02-16 DIAGNOSIS — I519 Heart disease, unspecified: Secondary | ICD-10-CM | POA: Insufficient documentation

## 2014-02-16 DIAGNOSIS — E785 Hyperlipidemia, unspecified: Secondary | ICD-10-CM | POA: Insufficient documentation

## 2014-02-16 DIAGNOSIS — I059 Rheumatic mitral valve disease, unspecified: Secondary | ICD-10-CM | POA: Diagnosis not present

## 2014-02-16 NOTE — Progress Notes (Signed)
2D Echo Performed 02/16/2014    Priest Lockridge, RCS  

## 2014-02-17 ENCOUNTER — Encounter (HOSPITAL_COMMUNITY): Payer: Self-pay | Admitting: Pharmacy Technician

## 2014-02-21 ENCOUNTER — Telehealth: Payer: Self-pay | Admitting: *Deleted

## 2014-02-21 ENCOUNTER — Encounter (HOSPITAL_COMMUNITY)
Admission: RE | Admit: 2014-02-21 | Discharge: 2014-02-21 | Disposition: A | Discharge status: Home / Self Care | Payer: BC Managed Care – PPO | Source: Ambulatory Visit | Attending: Anesthesiology | Admitting: Anesthesiology

## 2014-02-21 ENCOUNTER — Other Ambulatory Visit (HOSPITAL_COMMUNITY): Payer: Self-pay | Admitting: Orthopaedic Surgery

## 2014-02-21 ENCOUNTER — Encounter (HOSPITAL_COMMUNITY)
Admission: RE | Admit: 2014-02-21 | Discharge: 2014-02-21 | Disposition: A | Discharge status: Home / Self Care | Payer: BC Managed Care – PPO | Source: Ambulatory Visit | Attending: Orthopaedic Surgery | Admitting: Orthopaedic Surgery

## 2014-02-21 ENCOUNTER — Encounter (HOSPITAL_COMMUNITY): Payer: Self-pay

## 2014-02-21 DIAGNOSIS — Z01810 Encounter for preprocedural cardiovascular examination: Secondary | ICD-10-CM | POA: Insufficient documentation

## 2014-02-21 DIAGNOSIS — Z01812 Encounter for preprocedural laboratory examination: Secondary | ICD-10-CM | POA: Insufficient documentation

## 2014-02-21 DIAGNOSIS — Z01818 Encounter for other preprocedural examination: Secondary | ICD-10-CM | POA: Insufficient documentation

## 2014-02-21 HISTORY — DX: Dorsalgia, unspecified: M54.9

## 2014-02-21 HISTORY — DX: Other chronic pain: G89.29

## 2014-02-21 HISTORY — DX: Unspecified osteoarthritis, unspecified site: M19.90

## 2014-02-21 HISTORY — DX: Insomnia, unspecified: G47.00

## 2014-02-21 HISTORY — DX: Gastric ulcer, unspecified as acute or chronic, without hemorrhage or perforation: K25.9

## 2014-02-21 HISTORY — DX: Personal history of other diseases of the respiratory system: Z87.09

## 2014-02-21 HISTORY — DX: Headache: R51

## 2014-02-21 LAB — SURGICAL PCR SCREEN
MRSA, PCR: NEGATIVE
Staphylococcus aureus: NEGATIVE

## 2014-02-21 LAB — CBC
HCT: 38.9 % (ref 36.0–46.0)
Hemoglobin: 13.4 g/dL (ref 12.0–15.0)
MCH: 31.2 pg (ref 26.0–34.0)
MCHC: 34.4 g/dL (ref 30.0–36.0)
MCV: 90.7 fL (ref 78.0–100.0)
PLATELETS: 262 10*3/uL (ref 150–400)
RBC: 4.29 MIL/uL (ref 3.87–5.11)
RDW: 13.5 % (ref 11.5–15.5)
WBC: 6.8 10*3/uL (ref 4.0–10.5)

## 2014-02-21 LAB — BASIC METABOLIC PANEL
BUN: 13 mg/dL (ref 6–23)
CO2: 25 mEq/L (ref 19–32)
CREATININE: 0.64 mg/dL (ref 0.50–1.10)
Calcium: 9.1 mg/dL (ref 8.4–10.5)
Chloride: 104 mEq/L (ref 96–112)
GFR calc Af Amer: >90 mL/min (ref >90)
GLUCOSE: 103 mg/dL — AB (ref 70–99)
Potassium: 3.9 mEq/L (ref 3.7–5.3)
SODIUM: 141 meq/L (ref 137–147)

## 2014-02-21 LAB — TYPE AND SCREEN
ABO/RH(D): O POS
Antibody Screen: NEGATIVE

## 2014-02-21 LAB — ABO/RH: ABO/RH(D): O POS

## 2014-02-21 NOTE — Telephone Encounter (Signed)
Returned call and spoke w/ pt.  Informed results have not been reviewed by MD/PA and nurse will call, mail letter or release in MyChart after they are reviewed.  Pt verbalized understanding and agreed w/ plan.  

## 2014-02-21 NOTE — Telephone Encounter (Signed)
Pt was calling in regards to her Echo results. She stated that she has not heard back from us yet.  CH

## 2014-02-21 NOTE — Pre-Procedure Instructions (Signed)
Tammy Webster  02/21/2014   Your procedure is scheduled on:  Mon, Mar 9 @ 12:30 PM  Report to [facility] Short Stay Entrance A  at 10:30 AM.  Call this number if you have problems the morning of surgery: [phone]   Remember:   Do not eat food or drink liquids after midnight.   Take these medicines the morning of surgery with A SIP OF WATER: Lorazepam(Ativan)               No Goody's,BC's,Aleve,Aspirin,Ibuprofen,Fish Oil,or any Herbal Medications   Do not wear jewelry, make-up or nail polish.  Do not wear lotions, powders, or perfumes. You may wear deodorant.  Do not shave 48 hours prior to surgery.   Do not bring valuables to the hospital.  [facility] is not responsible                  for any belongings or valuables.               Contacts, dentures or bridgework may not be worn into surgery.  Leave suitcase in the car. After surgery it may be brought to your room.  For patients admitted to the hospital, discharge time is determined by your                treatment team.               [facility] - Preparing for Surgery  Before surgery, you can play an important role.  Because skin is not sterile, your skin needs to be as free of germs as possible.  You can reduce the number of germs on you skin by washing with CHG (chlorahexidine gluconate) soap before surgery.  CHG is an antiseptic cleaner which kills germs and bonds with the skin to continue killing germs even after washing.  Please DO NOT use if you have an allergy to CHG or antibacterial soaps.  If your skin becomes reddened/irritated stop using the CHG and inform your nurse when you arrive at Short Stay.  Do not shave (including legs and underarms) for at least 48 hours prior to the first CHG shower.  You may shave your face.  Please follow these instructions carefully:   1.  Shower with CHG Soap the night before surgery and the                                morning of Surgery.  2.  If you choose to wash your hair, wash  your hair first as usual with your       normal shampoo.  3.  After you shampoo, rinse your hair and body thoroughly to remove the                      Shampoo.  4.  Use CHG as you would any other liquid soap.  You can apply chg directly       to the skin and wash gently with scrungie or a clean washcloth.  5.  Apply the CHG Soap to your body ONLY FROM THE NECK DOWN.        Do not use on open wounds or open sores.  Avoid contact with your eyes,       ears, mouth and genitals (private parts).  Wash genitals (private parts)       with your normal soap.  6.  Wash  thoroughly, paying special attention to the area where your surgery        will be performed.  7.  Thoroughly rinse your body with warm water from the neck down.  8.  DO NOT shower/wash with your normal soap after using and rinsing off       the CHG Soap.  9.  Pat yourself dry with a clean towel.            10.  Wear clean pajamas.            11.  Place clean sheets on your bed the night of your first shower and do not        sleep with pets.  Day of Surgery  Do not apply any lotions/deoderants the morning of surgery.  Please wear clean clothes to the hospital/surgery center.    Special Instructions:    Please read over the following fact sheets that you were given: Pain Booklet, Coughing and Deep Breathing, Blood Transfusion Information, MRSA Information and Surgical Site Infection Prevention

## 2014-02-21 NOTE — Progress Notes (Addendum)
Cardiologist is Dr.Hilty and last visit was about 2wks ago with clearance note in epic  Echo and stress test reports in epic states was done bc XRAY showed enlarged heart but Hilty didn't want to clear for surgery until these tests were done(had flu and URI)  EKG in epic from 01-26-14  [facility]  Denies ever having a heart cath

## 2014-02-23 NOTE — H&P (Signed)
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   HISTORY OF PRESENT ILLNESS:  Patient has ongoing disc degeneration L5-S1 with moderately severe central lateral recess and foraminal stenosis.  She has received anti-inflammatories, has had narcotic medication intermittently and been through epidural steroid injections with Dr. Alvester MorinNewton.  She has pain with daily activities with complaints of back pain, buttocks pain, leg pain, leg weakness, pain with prolonged walking.  She has increased pain with standing and have to sit and get relief.  She has retrolisthesis at L5-S1 with MRI 03/04/2013 showing moderately severe stenosis at L5-S1, moderate impingement L4-L5 with borderline left foraminal stenosis with disc bulge.  Of note, this patient has transitional anatomy and has S1-S2 disc present.   MEDICATIONS:  Lorazepam 1 mg p.o. b.i.d. p.r.n.  She also has been on anti-inflammatories in the past including Aleve, ibuprofen, diclofenac.  She has had prednisone dose pack in the past as well.   PAST SURGICAL HISTORY:  Include tubal ligation 10/20/1987, carpal tunnel both hands doing well 2011, 2013.   FAMILY HISTORY:  Positive for diabetes, heart disease, breast and stomach cancer as well as hypertension.   SOCIAL HISTORY:  The patient is married to her husband, Noralyn PickCarroll.  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  She does not smoke but did smoke 1 pack per day for 30 years.  She does not drink.   REVIEW OF SYSTEMS:  14-point review of systems is positive for acid reflux, lumbar degenerative disc disease.   PHYSICAL EXAMINATION:  Patient is alert and oriented, 5 feet 5-1/2 inches, 209  pounds.  Extraocular movements intact.  Lungs are clear to auscultation.  No supraclavicular lymphadenopathy.  No rash over exposed skin.  Spine is straight.  There is no curvature.  She has bilateral sciatic notch tenderness, pain with straight leg raising at 80 degrees.  Reflexes are 2+.  EHL, anterior tib, gastrocsoleus, quads, hamstrings are normal.  Abdominal exam shows no liver or spleen enlargement. Normal bowel sounds.   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  Moderate narrowing at L4-L5.   PLAN:  We had a long discussion with patient as we had before concerning recommendations for decompression fusion L5-S1 where she has severe stenosis, short pedicles, bi foraminal stenosis, severe central stenosis with claudication symptoms.  She does have moderate narrowing at L4-L5 and plan would be decompression at L4-L5 as well.  Bilateral gutter effusion, interbody cage, local bone used for fusion, pedicle instrumentation.  We discussed injury to nerve root, dural tear, possible dural repair, adjacent level progression, re-operation, pedicle screw malposition, nerve root injury, hematoma, 2-3 day stay in the hospital, use of postoperative brace.  She understands and request that we proceed.       Mark C. Ophelia CharterYates, M.D.    Auto-Authenticated by Veverly FellsMark C. Ophelia CharterYates, M.D.

## 2014-02-27 MED ORDER — CEFAZOLIN SODIUM-DEXTROSE 2-3 GM-% IV SOLR
2.0000 g | INTRAVENOUS | Status: AC
  Administered 2014-02-28: 2 g via INTRAVENOUS
  Filled 2014-02-27 (×2): qty 50

## 2014-02-28 ENCOUNTER — Encounter (HOSPITAL_COMMUNITY)
Admission: RE | Disposition: A | Discharge status: Home / Self Care | Payer: BC Managed Care – PPO | Source: Ambulatory Visit | Attending: Orthopaedic Surgery

## 2014-02-28 ENCOUNTER — Ambulatory Visit (HOSPITAL_COMMUNITY): Payer: BC Managed Care – PPO | Admitting: Certified Registered"

## 2014-02-28 ENCOUNTER — Ambulatory Visit (HOSPITAL_COMMUNITY): Payer: BC Managed Care – PPO

## 2014-02-28 ENCOUNTER — Encounter (HOSPITAL_COMMUNITY): Payer: BC Managed Care – PPO | Admitting: Certified Registered"

## 2014-02-28 ENCOUNTER — Inpatient Hospital Stay (HOSPITAL_COMMUNITY)
Admission: RE | Admit: 2014-02-28 | Discharge: 2014-03-03 | DRG: 460 | Disposition: A | Discharge status: Home / Self Care | Payer: BC Managed Care – PPO | Source: Ambulatory Visit | Attending: Orthopaedic Surgery | Admitting: Orthopaedic Surgery

## 2014-02-28 ENCOUNTER — Encounter (HOSPITAL_COMMUNITY): Payer: Self-pay | Admitting: Certified Registered"

## 2014-02-28 DIAGNOSIS — I739 Peripheral vascular disease, unspecified: Secondary | ICD-10-CM | POA: Diagnosis present

## 2014-02-28 DIAGNOSIS — K219 Gastro-esophageal reflux disease without esophagitis: Secondary | ICD-10-CM | POA: Diagnosis present

## 2014-02-28 DIAGNOSIS — Z803 Family history of malignant neoplasm of breast: Secondary | ICD-10-CM

## 2014-02-28 DIAGNOSIS — Z8249 Family history of ischemic heart disease and other diseases of the circulatory system: Secondary | ICD-10-CM

## 2014-02-28 DIAGNOSIS — M48062 Spinal stenosis, lumbar region with neurogenic claudication: Secondary | ICD-10-CM | POA: Diagnosis present

## 2014-02-28 DIAGNOSIS — M5137 Other intervertebral disc degeneration, lumbosacral region: Principal | ICD-10-CM | POA: Diagnosis present

## 2014-02-28 DIAGNOSIS — Z9851 Tubal ligation status: Secondary | ICD-10-CM

## 2014-02-28 DIAGNOSIS — M51379 Other intervertebral disc degeneration, lumbosacral region without mention of lumbar back pain or lower extremity pain: Principal | ICD-10-CM | POA: Diagnosis present

## 2014-02-28 DIAGNOSIS — Z87891 Personal history of nicotine dependence: Secondary | ICD-10-CM

## 2014-02-28 DIAGNOSIS — Z833 Family history of diabetes mellitus: Secondary | ICD-10-CM

## 2014-02-28 DIAGNOSIS — Z800 Family history of malignant neoplasm of digestive organs: Secondary | ICD-10-CM

## 2014-02-28 DIAGNOSIS — G8918 Other acute postprocedural pain: Secondary | ICD-10-CM | POA: Diagnosis not present

## 2014-02-28 SURGERY — POSTERIOR LUMBAR FUSION 1 LEVEL
Anesthesia: General | Site: Back

## 2014-02-28 MED ORDER — KETOROLAC TROMETHAMINE 30 MG/ML IJ SOLN
30.0000 mg | Freq: Four times a day (QID) | INTRAMUSCULAR | Status: AC
  Administered 2014-02-28 – 2014-03-01 (×5): 30 mg via INTRAVENOUS
  Filled 2014-02-28 (×7): qty 1

## 2014-02-28 MED ORDER — LIDOCAINE HCL (CARDIAC) 20 MG/ML IV SOLN
INTRAVENOUS | Status: AC
  Filled 2014-02-28: qty 5

## 2014-02-28 MED ORDER — HYDROMORPHONE HCL PF 1 MG/ML IJ SOLN
INTRAMUSCULAR | Status: AC
  Filled 2014-02-28: qty 2

## 2014-02-28 MED ORDER — MORPHINE SULFATE 2 MG/ML IJ SOLN
1.0000 mg | INTRAMUSCULAR | Status: DC | PRN
  Administered 2014-02-28 – 2014-03-01 (×6): 2 mg via INTRAVENOUS
  Filled 2014-02-28 (×5): qty 1

## 2014-02-28 MED ORDER — PHENYLEPHRINE HCL 10 MG/ML IJ SOLN
INTRAMUSCULAR | Status: DC | PRN
  Administered 2014-02-28: 120 ug via INTRAVENOUS

## 2014-02-28 MED ORDER — MORPHINE SULFATE 2 MG/ML IJ SOLN
INTRAMUSCULAR | Status: AC
  Filled 2014-02-28: qty 1

## 2014-02-28 MED ORDER — BSS IO SOLN
INTRAOCULAR | Status: AC
  Filled 2014-02-28: qty 15

## 2014-02-28 MED ORDER — STERILE WATER FOR INJECTION IJ SOLN
INTRAMUSCULAR | Status: AC
  Filled 2014-02-28: qty 10

## 2014-02-28 MED ORDER — DOCUSATE SODIUM 100 MG PO CAPS
100.0000 mg | ORAL_CAPSULE | Freq: Two times a day (BID) | ORAL | Status: DC
  Administered 2014-02-28 – 2014-03-03 (×6): 100 mg via ORAL
  Filled 2014-02-28 (×7): qty 1

## 2014-02-28 MED ORDER — PANTOPRAZOLE SODIUM 40 MG IV SOLR
40.0000 mg | Freq: Every day | INTRAVENOUS | Status: DC
  Filled 2014-02-28: qty 40

## 2014-02-28 MED ORDER — FENTANYL CITRATE 0.05 MG/ML IJ SOLN
INTRAMUSCULAR | Status: DC | PRN
  Administered 2014-02-28: 50 ug via INTRAVENOUS
  Administered 2014-02-28: 100 ug via INTRAVENOUS
  Administered 2014-02-28: 150 ug via INTRAVENOUS

## 2014-02-28 MED ORDER — KETOROLAC TROMETHAMINE 30 MG/ML IJ SOLN
INTRAMUSCULAR | Status: AC
Start: 2014-02-28 — End: 2014-03-01
  Filled 2014-02-28: qty 1

## 2014-02-28 MED ORDER — MIDAZOLAM HCL 5 MG/5ML IJ SOLN
INTRAMUSCULAR | Status: DC | PRN
  Administered 2014-02-28: 2 mg via INTRAVENOUS

## 2014-02-28 MED ORDER — PHENYLEPHRINE 40 MCG/ML (10ML) SYRINGE FOR IV PUSH (FOR BLOOD PRESSURE SUPPORT)
PREFILLED_SYRINGE | INTRAVENOUS | Status: AC
  Filled 2014-02-28: qty 10

## 2014-02-28 MED ORDER — FENTANYL CITRATE 0.05 MG/ML IJ SOLN
INTRAMUSCULAR | Status: AC
  Filled 2014-02-28: qty 5

## 2014-02-28 MED ORDER — HYDROMORPHONE HCL PF 1 MG/ML IJ SOLN
0.2500 mg | INTRAMUSCULAR | Status: DC | PRN
  Administered 2014-02-28 (×4): 0.5 mg via INTRAVENOUS

## 2014-02-28 MED ORDER — ACETAMINOPHEN 325 MG PO TABS
650.0000 mg | ORAL_TABLET | ORAL | Status: DC | PRN
  Administered 2014-03-01: 650 mg via ORAL
  Filled 2014-02-28: qty 2

## 2014-02-28 MED ORDER — CEFAZOLIN SODIUM 1-5 GM-% IV SOLN
1.0000 g | Freq: Three times a day (TID) | INTRAVENOUS | Status: AC
  Administered 2014-03-01 (×2): 1 g via INTRAVENOUS
  Filled 2014-02-28 (×2): qty 50

## 2014-02-28 MED ORDER — EPHEDRINE SULFATE 50 MG/ML IJ SOLN
INTRAMUSCULAR | Status: AC
  Filled 2014-02-28: qty 1

## 2014-02-28 MED ORDER — SUCCINYLCHOLINE CHLORIDE 20 MG/ML IJ SOLN
INTRAMUSCULAR | Status: AC
  Filled 2014-02-28: qty 1

## 2014-02-28 MED ORDER — PROPOFOL 10 MG/ML IV BOLUS
INTRAVENOUS | Status: DC | PRN
  Administered 2014-02-28: 170 mg via INTRAVENOUS

## 2014-02-28 MED ORDER — SENNOSIDES-DOCUSATE SODIUM 8.6-50 MG PO TABS
1.0000 | ORAL_TABLET | Freq: Every evening | ORAL | Status: DC | PRN
  Administered 2014-03-01: 1 via ORAL
  Filled 2014-02-28: qty 1

## 2014-02-28 MED ORDER — PHENYLEPHRINE HCL 10 MG/ML IJ SOLN
10.0000 mg | INTRAMUSCULAR | Status: DC | PRN
  Administered 2014-02-28: 20 ug/min via INTRAVENOUS

## 2014-02-28 MED ORDER — ROCURONIUM BROMIDE 100 MG/10ML IV SOLN
INTRAVENOUS | Status: DC | PRN
  Administered 2014-02-28 (×3): 10 mg via INTRAVENOUS
  Administered 2014-02-28: 50 mg via INTRAVENOUS
  Administered 2014-02-28: 20 mg via INTRAVENOUS

## 2014-02-28 MED ORDER — ALBUMIN HUMAN 5 % IV SOLN
INTRAVENOUS | Status: DC | PRN
  Administered 2014-02-28: 14:00:00 via INTRAVENOUS

## 2014-02-28 MED ORDER — LACTATED RINGERS IV SOLN
INTRAVENOUS | Status: DC
  Administered 2014-02-28: 11:00:00 via INTRAVENOUS

## 2014-02-28 MED ORDER — ONDANSETRON HCL 4 MG/2ML IJ SOLN
INTRAMUSCULAR | Status: DC | PRN
  Administered 2014-02-28: 4 mg via INTRAVENOUS

## 2014-02-28 MED ORDER — METHOCARBAMOL 100 MG/ML IJ SOLN
500.0000 mg | Freq: Four times a day (QID) | INTRAVENOUS | Status: DC | PRN
  Administered 2014-02-28: 500 mg via INTRAVENOUS
  Filled 2014-02-28 (×2): qty 5

## 2014-02-28 MED ORDER — KCL IN DEXTROSE-NACL 20-5-0.45 MEQ/L-%-% IV SOLN
INTRAVENOUS | Status: DC
  Administered 2014-02-28: 23:00:00 via INTRAVENOUS
  Filled 2014-02-28 (×6): qty 1000

## 2014-02-28 MED ORDER — GLYCOPYRROLATE 0.2 MG/ML IJ SOLN
INTRAMUSCULAR | Status: DC | PRN
  Administered 2014-02-28: 0.6 mg via INTRAVENOUS

## 2014-02-28 MED ORDER — PANTOPRAZOLE SODIUM 40 MG PO TBEC
40.0000 mg | DELAYED_RELEASE_TABLET | Freq: Every day | ORAL | Status: DC
  Administered 2014-02-28 – 2014-03-02 (×3): 40 mg via ORAL
  Filled 2014-02-28: qty 1

## 2014-02-28 MED ORDER — SODIUM CHLORIDE 0.9 % IJ SOLN
3.0000 mL | INTRAMUSCULAR | Status: DC | PRN
  Administered 2014-03-02: 3 mL via INTRAVENOUS

## 2014-02-28 MED ORDER — MENTHOL 3 MG MT LOZG
1.0000 | LOZENGE | OROMUCOSAL | Status: DC | PRN
  Administered 2014-03-01: 3 mg via ORAL
  Filled 2014-02-28: qty 9

## 2014-02-28 MED ORDER — ONDANSETRON HCL 4 MG/2ML IJ SOLN
4.0000 mg | INTRAMUSCULAR | Status: DC | PRN
  Administered 2014-03-01 – 2014-03-02 (×2): 4 mg via INTRAVENOUS
  Filled 2014-02-28 (×2): qty 2

## 2014-02-28 MED ORDER — OXYCODONE HCL 5 MG PO TABS
ORAL_TABLET | ORAL | Status: AC
  Administered 2014-02-28: 5 mg
  Filled 2014-02-28: qty 1

## 2014-02-28 MED ORDER — THROMBIN 20000 UNITS EX SOLR
CUTANEOUS | Status: AC
  Filled 2014-02-28: qty 20000

## 2014-02-28 MED ORDER — SODIUM CHLORIDE 0.9 % IV SOLN: 250.0000 mL | INTRAVENOUS | Status: DC

## 2014-02-28 MED ORDER — ACETAMINOPHEN 650 MG RE SUPP
650.0000 mg | RECTAL | Status: DC | PRN
Start: 2014-02-28 — End: 2014-03-03

## 2014-02-28 MED ORDER — ARTIFICIAL TEARS OP OINT
TOPICAL_OINTMENT | OPHTHALMIC | Status: AC
  Filled 2014-02-28: qty 3.5

## 2014-02-28 MED ORDER — OXYCODONE-ACETAMINOPHEN 5-325 MG PO TABS
1.0000 | ORAL_TABLET | ORAL | Status: DC | PRN
  Administered 2014-02-28 – 2014-03-01 (×3): 2 via ORAL
  Filled 2014-02-28 (×4): qty 2

## 2014-02-28 MED ORDER — THROMBIN 20000 UNITS EX SOLR
CUTANEOUS | Status: DC | PRN
  Administered 2014-02-28: 13:00:00

## 2014-02-28 MED ORDER — MIDAZOLAM HCL 2 MG/2ML IJ SOLN
INTRAMUSCULAR | Status: AC
  Filled 2014-02-28: qty 2

## 2014-02-28 MED ORDER — PHENOL 1.4 % MT LIQD: 1.0000 | OROMUCOSAL | Status: DC | PRN

## 2014-02-28 MED ORDER — PROPOFOL 10 MG/ML IV BOLUS
INTRAVENOUS | Status: AC
  Filled 2014-02-28: qty 20

## 2014-02-28 MED ORDER — FLEET ENEMA 7-19 GM/118ML RE ENEM: 1.0000 | ENEMA | Freq: Once | RECTAL | Status: AC | PRN

## 2014-02-28 MED ORDER — LORAZEPAM 1 MG PO TABS
1.0000 mg | ORAL_TABLET | Freq: Three times a day (TID) | ORAL | Status: DC | PRN
  Administered 2014-03-01: 1 mg via ORAL
  Filled 2014-02-28: qty 1

## 2014-02-28 MED ORDER — 0.9 % SODIUM CHLORIDE (POUR BTL) OPTIME
TOPICAL | Status: DC | PRN
Start: 2014-02-28 — End: 2014-02-28
  Administered 2014-02-28: 1000 mL

## 2014-02-28 MED ORDER — METHOCARBAMOL 500 MG PO TABS: 500.0000 mg | ORAL_TABLET | Freq: Four times a day (QID) | ORAL | Status: DC | PRN

## 2014-02-28 MED ORDER — ONDANSETRON HCL 4 MG/2ML IJ SOLN
INTRAMUSCULAR | Status: AC
  Filled 2014-02-28: qty 2

## 2014-02-28 MED ORDER — LACTATED RINGERS IV SOLN
INTRAVENOUS | Status: DC | PRN
  Administered 2014-02-28 (×3): via INTRAVENOUS

## 2014-02-28 MED ORDER — ROCURONIUM BROMIDE 50 MG/5ML IV SOLN
INTRAVENOUS | Status: AC
  Filled 2014-02-28: qty 1

## 2014-02-28 MED ORDER — ARTIFICIAL TEARS OP OINT
TOPICAL_OINTMENT | OPHTHALMIC | Status: DC | PRN
  Administered 2014-02-28: 1 via OPHTHALMIC

## 2014-02-28 MED ORDER — NEOSTIGMINE METHYLSULFATE 1 MG/ML IJ SOLN
INTRAMUSCULAR | Status: DC | PRN
  Administered 2014-02-28: 4 mg via INTRAVENOUS

## 2014-02-28 MED ORDER — SODIUM CHLORIDE 0.9 % IJ SOLN
3.0000 mL | Freq: Two times a day (BID) | INTRAMUSCULAR | Status: DC
  Administered 2014-03-01 – 2014-03-03 (×5): 3 mL via INTRAVENOUS

## 2014-02-28 MED ORDER — LIDOCAINE HCL (CARDIAC) 20 MG/ML IV SOLN
INTRAVENOUS | Status: DC | PRN
  Administered 2014-02-28: 80 mg via INTRAVENOUS

## 2014-02-28 MED ORDER — OXYCODONE-ACETAMINOPHEN 5-325 MG PO TABS: 1.0000 | ORAL_TABLET | ORAL | Status: DC | PRN

## 2014-02-28 MED ORDER — HYDROCODONE-ACETAMINOPHEN 5-325 MG PO TABS
1.0000 | ORAL_TABLET | ORAL | Status: DC | PRN
  Administered 2014-03-02: 2 via ORAL
  Administered 2014-03-02: 1 via ORAL
  Administered 2014-03-02: 2 via ORAL
  Filled 2014-02-28 (×3): qty 2

## 2014-02-28 MED ORDER — METHOCARBAMOL 500 MG PO TABS
500.0000 mg | ORAL_TABLET | Freq: Four times a day (QID) | ORAL | Status: DC | PRN
  Administered 2014-02-28 – 2014-03-03 (×8): 500 mg via ORAL
  Filled 2014-02-28 (×9): qty 1

## 2014-02-28 MED ORDER — GLYCOPYRROLATE 0.2 MG/ML IJ SOLN
INTRAMUSCULAR | Status: AC
  Filled 2014-02-28: qty 3

## 2014-02-28 MED ORDER — WHITE PETROLATUM GEL
Status: AC
  Administered 2014-02-28: 1
  Filled 2014-02-28: qty 5

## 2014-02-28 MED ORDER — BISACODYL 10 MG RE SUPP: 10.0000 mg | Freq: Every day | RECTAL | Status: DC | PRN

## 2014-02-28 MED ORDER — NEOSTIGMINE METHYLSULFATE 1 MG/ML IJ SOLN
INTRAMUSCULAR | Status: AC
  Filled 2014-02-28: qty 10

## 2014-02-28 SURGICAL SUPPLY — 71 items
ADH SKN CLS APL DERMABOND .7 (GAUZE/BANDAGES/DRESSINGS) ×1
BLADE SURG ROTATE 9660 (MISCELLANEOUS) IMPLANT
BUR ROUND FLUTED 4 SOFT TCH (BURR) ×2 IMPLANT
BUR ROUND FLUTED 4MM SOFT TCH (BURR) ×1
CAP SPINAL LOCKING TI (Cap) ×12 IMPLANT
CORDS BIPOLAR (ELECTRODE) ×3 IMPLANT
COVER SURGICAL LIGHT HANDLE (MISCELLANEOUS) ×3 IMPLANT
DERMABOND ADVANCED (GAUZE/BANDAGES/DRESSINGS) ×2
DERMABOND ADVANCED .7 DNX12 (GAUZE/BANDAGES/DRESSINGS) ×1 IMPLANT
DRAPE C-ARM 42X72 X-RAY (DRAPES) ×3 IMPLANT
DRAPE MICROSCOPE LEICA (MISCELLANEOUS) ×3 IMPLANT
DRAPE SURG 17X23 STRL (DRAPES) ×9 IMPLANT
DRAPE TABLE COVER HEAVY DUTY (DRAPES) ×3 IMPLANT
DRSG EMULSION OIL 3X3 NADH (GAUZE/BANDAGES/DRESSINGS) ×3 IMPLANT
DRSG MEPILEX BORDER 4X4 (GAUZE/BANDAGES/DRESSINGS) ×3 IMPLANT
DRSG MEPILEX BORDER 4X8 (GAUZE/BANDAGES/DRESSINGS) ×3 IMPLANT
DRSG PAD ABDOMINAL 8X10 ST (GAUZE/BANDAGES/DRESSINGS) ×6 IMPLANT
DURAPREP 26ML APPLICATOR (WOUND CARE) ×3 IMPLANT
ELECT BLADE 4.0 EZ CLEAN MEGAD (MISCELLANEOUS) ×3
ELECT CAUTERY BLADE 6.4 (BLADE) ×3 IMPLANT
ELECT REM PT RETURN 9FT ADLT (ELECTROSURGICAL) ×3
ELECTRODE BLDE 4.0 EZ CLN MEGD (MISCELLANEOUS) ×1 IMPLANT
ELECTRODE REM PT RTRN 9FT ADLT (ELECTROSURGICAL) ×1 IMPLANT
EVACUATOR 1/8 PVC DRAIN (DRAIN) ×3 IMPLANT
GLOVE BIO SURGEON STRL SZ 6.5 (GLOVE) ×2 IMPLANT
GLOVE BIO SURGEONS STRL SZ 6.5 (GLOVE) ×1
GLOVE BIOGEL PI IND STRL 6.5 (GLOVE) ×1 IMPLANT
GLOVE BIOGEL PI IND STRL 7.5 (GLOVE) ×1 IMPLANT
GLOVE BIOGEL PI IND STRL 8 (GLOVE) ×1 IMPLANT
GLOVE BIOGEL PI INDICATOR 6.5 (GLOVE) ×2
GLOVE BIOGEL PI INDICATOR 7.5 (GLOVE) ×2
GLOVE BIOGEL PI INDICATOR 8 (GLOVE) ×2
GLOVE ECLIPSE 7.0 STRL STRAW (GLOVE) ×3 IMPLANT
GLOVE ORTHO TXT STRL SZ7.5 (GLOVE) ×3 IMPLANT
GOWN PREVENTION PLUS LG XLONG (DISPOSABLE) ×6 IMPLANT
GOWN STRL NON-REIN LRG LVL3 (GOWN DISPOSABLE) ×6 IMPLANT
HEMOSTAT SURGICEL 2X14 (HEMOSTASIS) IMPLANT
KIT BASIN OR (CUSTOM PROCEDURE TRAY) ×3 IMPLANT
KIT POSITION SURG JACKSON T1 (MISCELLANEOUS) ×3 IMPLANT
KIT ROOM TURNOVER OR (KITS) ×3 IMPLANT
MANIFOLD NEPTUNE II (INSTRUMENTS) ×3 IMPLANT
NDL SUT .5 MAYO 1.404X.05X (NEEDLE) ×1 IMPLANT
NEEDLE MAYO TAPER (NEEDLE) ×3
NS IRRIG 1000ML POUR BTL (IV SOLUTION) ×3 IMPLANT
PACK LAMINECTOMY ORTHO (CUSTOM PROCEDURE TRAY) ×3 IMPLANT
PAD ARMBOARD 7.5X6 YLW CONV (MISCELLANEOUS) ×6 IMPLANT
PATTIES SURGICAL .5 X.5 (GAUZE/BANDAGES/DRESSINGS) ×3 IMPLANT
PATTIES SURGICAL .75X.75 (GAUZE/BANDAGES/DRESSINGS) ×3 IMPLANT
ROD 35MMX5.5 (Rod) ×3 IMPLANT
ROD 40MM (Rod) ×3 IMPLANT
ROD SPNL CVD 40X5.5XHRD NS (Rod) ×1 IMPLANT
SCREW MATRIX MIS 6.0X40MM (Screw) ×12 IMPLANT
SPACER CONCORDE CUR 5DEG L11MM (Orthopedic Implant) ×3 IMPLANT
SPONGE LAP 18X18 X RAY DECT (DISPOSABLE) ×3 IMPLANT
SPONGE LAP 4X18 X RAY DECT (DISPOSABLE) ×6 IMPLANT
SPONGE SURGIFOAM ABS GEL 100 (HEMOSTASIS) ×3 IMPLANT
STAPLER VISISTAT 35W (STAPLE) IMPLANT
SURGIFLO TRUKIT (HEMOSTASIS) ×3 IMPLANT
SUT BONE WAX W31G (SUTURE) IMPLANT
SUT VIC AB 0 CT1 27 (SUTURE) ×9
SUT VIC AB 0 CT1 27XBRD ANBCTR (SUTURE) ×3 IMPLANT
SUT VIC AB 2-0 CT1 27 (SUTURE) ×3
SUT VIC AB 2-0 CT1 TAPERPNT 27 (SUTURE) ×1 IMPLANT
SUT VICRYL 0 TIES 12 18 (SUTURE) ×3 IMPLANT
SUT VICRYL 4-0 PS2 18IN ABS (SUTURE) IMPLANT
SUT VICRYL AB 2 0 TIES (SUTURE) ×3 IMPLANT
TOWEL OR 17X24 6PK STRL BLUE (TOWEL DISPOSABLE) ×3 IMPLANT
TOWEL OR 17X26 10 PK STRL BLUE (TOWEL DISPOSABLE) ×3 IMPLANT
TRAY FOLEY CATH 16FRSI W/METER (SET/KITS/TRAYS/PACK) ×3 IMPLANT
WATER STERILE IRR 1000ML POUR (IV SOLUTION) IMPLANT
YANKAUER SUCT BULB TIP NO VENT (SUCTIONS) ×3 IMPLANT

## 2014-02-28 NOTE — Transfer of Care (Signed)
Immediate Anesthesia Transfer of Care Note  Patient: Derek MoundDana W Townley  Procedure(s) Performed: Procedure(s) with comments: POSTERIOR LUMBAR FUSION 1 LEVEL (N/A) - L5-S1 TLIF, Pedicle Instrumentation, Cage, Bilateral Fusion, L4-5 Decompression  Patient Location: PACU  Anesthesia Type:General  Level of Consciousness: awake, alert  and oriented  Airway & Oxygen Therapy: Patient Spontanous Breathing and Patient connected to nasal cannula oxygen  Post-op Assessment: Report given to PACU RN, Post -op Vital signs reviewed and stable and Patient moving all extremities X 4  Post vital signs: Reviewed and stable  Complications: No apparent anesthesia complications

## 2014-02-28 NOTE — Anesthesia Preprocedure Evaluation (Addendum)
Anesthesia Evaluation  Patient identified by MRN, date of birth, ID band Patient awake    Reviewed: Allergy & Precautions, H&P , NPO status , Patient's Chart, lab work & pertinent test results  Airway Mallampati: II TM Distance: >3 FB Neck ROM: Full    Dental  (+) Dental Advisory Given, Partial Upper, Partial Lower   Pulmonary shortness of breath, pneumonia -, former smoker,  breath sounds clear to auscultation        Cardiovascular Rhythm:Regular Rate:Normal     Neuro/Psych  Headaches,    GI/Hepatic Neg liver ROS, PUD, GERD-  ,  Endo/Other  negative endocrine ROS  Renal/GU negative Renal ROS     Musculoskeletal   Abdominal   Peds  Hematology  (+) anemia ,   Anesthesia Other Findings   Reproductive/Obstetrics                         Anesthesia Physical Anesthesia Plan  ASA: III  Anesthesia Plan: General   Post-op Pain Management:    Induction: Intravenous  Airway Management Planned: Oral ETT  Additional Equipment:   Intra-op Plan:   Post-operative Plan:   Informed Consent: I have reviewed the patients History and Physical, chart, labs and discussed the procedure including the risks, benefits and alternatives for the proposed anesthesia with the patient or authorized representative who has indicated his/her understanding and acceptance.   Dental advisory given  Plan Discussed with: CRNA, Anesthesiologist and Surgeon  Anesthesia Plan Comments:         Anesthesia Quick Evaluation

## 2014-02-28 NOTE — Interval H&P Note (Signed)
History and Physical Interval Note:  02/28/2014 12:10 PM  Tammy Webster  has presented today for surgery, with the diagnosis of L5-S1 Central and Foraminal Stenosis  The various methods of treatment have been discussed with the patient and family. After consideration of risks, benefits and other options for treatment, the patient has consented to  Procedure(s) with comments: POSTERIOR LUMBAR FUSION 1 LEVEL (N/A) - L5-S1 TLIF, Pedicle Instrumentation, Cage, Bilateral Fusion, L4-5 Decompression as a surgical intervention .  The patient's history has been reviewed, patient examined, no change in status, stable for surgery.  I have reviewed the patient's chart and labs.  Questions were answered to the patient's satisfaction.     [name] C

## 2014-02-28 NOTE — Plan of Care (Signed)
Problem: Consults Goal: Diagnosis - Spinal Surgery Thoraco/Lumbar Spine Fusion L5-S1 TLIF, L4-5 decompression

## 2014-02-28 NOTE — Brief Op Note (Signed)
02/28/2014  4:26 PM  PATIENT:  Tammy Webster  53 y.o. female  PRE-OPERATIVE DIAGNOSIS: L4-5 and  L5-S1 Central and Foraminal Stenosis  POST-OPERATIVE DIAGNOSIS:L4-5 and  L5-S1 Central and Foraminal Stenosis  PROCEDURE:  Procedure(s) with comments: POSTERIOR LUMBAR FUSION 1 LEVEL (N/A) - L5-S1 TLIF, Pedicle Instrumentation, Cage, Bilateral Fusion, L4-5 Decompression  SURGEON:  Surgeon(s) and Role:    * Eldred MangesMark C Yates, MD - Primary  PHYSICIAN ASSISTANT: Maud DeedSheila Jyll Tomaro [facility]  ASSISTANTS: none   ANESTHESIA:   general  EBL:  Total I/O In: 2630 [I.V.:2200; Blood:180; IV Piggyback:250] Out: 550 [Urine:150; Blood:400]  BLOOD ADMINISTERED: cell saver 180cc  DRAINS: none   LOCAL MEDICATIONS USED:  NONE  SPECIMEN:  No Specimen  DISPOSITION OF SPECIMEN:  N/A  COUNTS:  YES  TOURNIQUET:  * No tourniquets in log *  DICTATION: .Note written in EPIC  PLAN OF CARE: Admit to inpatient   PATIENT DISPOSITION:  PACU - hemodynamically stable.   Delay start of Pharmacological VTE agent (>24hrs) due to surgical blood loss or risk of bleeding: yes

## 2014-02-28 NOTE — Progress Notes (Signed)
May omit pt,cmet,u/a [facility].

## 2014-02-28 NOTE — Anesthesia Postprocedure Evaluation (Signed)
  Anesthesia Post-op Note  Patient: Derek MoundDana W Hoglund  Procedure(s) Performed: Procedure(s) with comments: POSTERIOR LUMBAR FUSION 1 LEVEL (N/A) - L5-S1 TLIF, Pedicle Instrumentation, Cage, Bilateral Fusion, L4-5 Decompression  Patient Location: PACU  Anesthesia Type:General  Level of Consciousness: awake  Airway and Oxygen Therapy: Patient Spontanous Breathing  Post-op Pain: mild  Post-op Assessment: Post-op Vital signs reviewed  Post-op Vital Signs: Reviewed  Complications: No apparent anesthesia complications

## 2014-02-28 NOTE — Progress Notes (Signed)
Orthopedic Tech Progress Note Patient Details:  Tammy Webster 04-29-1961 [id number] [employer] called to fit patient with hard shell Lumbar fusion brace. Dr. Ophelia CharterYates prefers hard over those that we stock.  Patient ID: Tammy Webster, female   DOB: 04-29-1961, 53 y.o.   MRN: [id number]   Tammy Webster 02/28/2014, 4:58 PM

## 2014-02-28 NOTE — Discharge Instructions (Signed)

## 2014-02-28 NOTE — Anesthesia Procedure Notes (Signed)
Procedure Name: Intubation Date/Time: 02/28/2014 12:40 PM Performed by: Lanell MatarBAKER, Surabhi Gadea M Pre-anesthesia Checklist: Patient identified, Timeout performed, Emergency Drugs available, Suction available and Patient being monitored Patient Re-evaluated:Patient Re-evaluated prior to inductionOxygen Delivery Method: Circle system utilized Preoxygenation: Pre-oxygenation with 100% oxygen Intubation Type: IV induction Ventilation: Mask ventilation without difficulty Laryngoscope Size: Miller and 2 Grade View: Grade I Tube type: Oral Tube size: 7.0 mm Number of attempts: 1 Airway Equipment and Method: Stylet Placement Confirmation: ETT inserted through vocal cords under direct vision,  breath sounds checked- equal and bilateral,  positive ETCO2 and CO2 detector Secured at: 21 cm Tube secured with: Tape Dental Injury: Teeth and Oropharynx as per pre-operative assessment

## 2014-03-01 LAB — URINALYSIS, ROUTINE W REFLEX MICROSCOPIC
Bilirubin Urine: NEGATIVE
Glucose, UA: NEGATIVE mg/dL
Hgb urine dipstick: NEGATIVE
Ketones, ur: NEGATIVE mg/dL
NITRITE: NEGATIVE
PROTEIN: NEGATIVE mg/dL
Specific Gravity, Urine: 1.018 (ref 1.005–1.030)
UROBILINOGEN UA: 0.2 mg/dL (ref 0.0–1.0)
pH: 5 (ref 5.0–8.0)

## 2014-03-01 LAB — CBC
HCT: 30.5 % — ABNORMAL LOW (ref 36.0–46.0)
Hemoglobin: 10.3 g/dL — ABNORMAL LOW (ref 12.0–15.0)
MCH: 30.8 pg (ref 26.0–34.0)
MCHC: 33.8 g/dL (ref 30.0–36.0)
MCV: 91.3 fL (ref 78.0–100.0)
Platelets: 207 10*3/uL (ref 150–400)
RBC: 3.34 MIL/uL — ABNORMAL LOW (ref 3.87–5.11)
RDW: 13.5 % (ref 11.5–15.5)
WBC: 9 10*3/uL (ref 4.0–10.5)

## 2014-03-01 LAB — BASIC METABOLIC PANEL
BUN: 12 mg/dL (ref 6–23)
CO2: 24 mEq/L (ref 19–32)
CREATININE: 0.62 mg/dL (ref 0.50–1.10)
Calcium: 8.2 mg/dL — ABNORMAL LOW (ref 8.4–10.5)
Chloride: 102 mEq/L (ref 96–112)
GFR calc non Af Amer: >90 mL/min (ref >90)
Glucose, Bld: 115 mg/dL — ABNORMAL HIGH (ref 70–99)
POTASSIUM: 4 meq/L (ref 3.7–5.3)
Sodium: 138 mEq/L (ref 137–147)

## 2014-03-01 LAB — URINE MICROSCOPIC-ADD ON

## 2014-03-01 MED ORDER — OXYCODONE HCL 5 MG PO TABS
5.0000 mg | ORAL_TABLET | ORAL | Status: DC | PRN
  Administered 2014-03-01 – 2014-03-03 (×13): 10 mg via ORAL
  Filled 2014-03-01 (×13): qty 2

## 2014-03-01 MED FILL — Sodium Chloride IV Soln 0.9%: INTRAVENOUS | Qty: 2000 | Status: AC

## 2014-03-01 MED FILL — Heparin Sodium (Porcine) Inj 1000 Unit/ML: INTRAMUSCULAR | Qty: 30 | Status: AC

## 2014-03-01 NOTE — Progress Notes (Signed)
Utilization review completed.  

## 2014-03-01 NOTE — Op Note (Signed)
NAMEKAMBREY, HAGGER                 ACCOUNT NO.:  [id number]  MEDICAL RECORD NO.:  [id number]  LOCATION:  5N32C                        FACILITY:  [facility]  PHYSICIAN:  Kymiah Araiza C. Ophelia Charter, M.D.    DATE OF BIRTH:  June 04, 1961  DATE OF PROCEDURE: DATE OF DISCHARGE:                              OPERATIVE REPORT   PREOPERATIVE DIAGNOSIS:  L5-S1 retrolisthesis with instability and severe central foraminal and lateral recess stenosis, moderate L4-5 adjacent level stenosis.  POSTOPERATIVE DIAGNOSIS:  L5-S1 retrolisthesis with instability and severe central foraminal and lateral recess stenosis, moderate L4-5 adjacent level stenosis.  PROCEDURE:  L4-5 decompression.  L5-S1 Gill procedure.  Right L5-S1  TLIF with 11 mm peek/carbon body cage lordotic DePuy.  Local interbody bone, bilateral lateral gutter fusion.  Pedicle instrumentation.  Synthes matrix 6 x 40 mm screws at L5 and S1, with 40 mm rod on the right, 35 mm rod on the left (Interbody cage is DePuy Concorde bullet).  SURGEON:  Virginia Curl C. Ophelia Charter, M.D.  ASSISTANT:  Maud Deed, PA-C, medically necessary and present for the entire procedure.  ANESTHESIA:  General plus Marcaine local.  COMPLICATIONS:  None.  EBL:  400-500 with _cell saver_ re-transfusion.  COMPLICATIONS:  None.  FINDINGS:  The patient had retrolisthesis instability central lateral recess and foraminal stenosis at L5-S1 which has progressed.  There was moderate stenosis at the adjacent level without instability in the 4-5 level needed simple decompression.  _Local bone from decopression _ was used laterally with 360 fusion bone anterior to the interbody cage bone inside the cage bone and both lateral gutters inner transverse process fusion.  OPERATIVE PROCEDURE:  After induction of general anesthesia, Foley catheter was placed.  The patient was placed prone on the spine frame, careful padding, positioning, adjustments.  Arms were at _padded__ yellow foam pads anterior  to the shoulders and yellow foam __________ the arm.  Ulnar nerve was protected.  The lumbar region was squared with __________ drapes, prep with DuraPrep, usual towels x4, Betadine, Steri- Drape, and laminectomy sheets and drapes.  Time-out procedure was completed before the incision.  Midline incision was made. Subperiosteal dissection, and once exposure was made at the expected level based on palpation with the patient _transitional__ anatomy at the lumbosacral junction with the presence of S1-S2 disk.  Two Kocher clamps were placed over the 4-5 level.  The patient's stenosis was primarily ligamentous with stenosis at the disc _ at 4-5 level and 3 mm above the disk space, there was no remaining stenosis.  Kocher clamp was placed just above the disk space and the other overlying the 5-1 level, cross- table lateral C-arm documenting the appropriate level.  These were marked on bone with permanent skin marker.  The _viper__ retractor was used.  Subperiosteal dissection on the transverse processes at L5 and S1 for planned fusion.  S1 had a transverse processes on both sides. Lamina was removed.  The spinous processes were removed.  Bone was meticulously cleaned by the scrub nurse, decorticated for fusion and chopped into small grape-nut sized pieces.  Decompression was performed removing very thick chunks of ligament.  There was some scarring down to the dura.  Careful dissection,  dura was protected with patties.  Chunks of ligament were removed.  Foraminotomies were performed on the right side __________ patient was most symptomatic.  The facet joint was removed using a combination of osteotomes and rongeurs.  Disk space was visualized and a Penfield 4 was placed over it and cross-table lateral C- arm was re-documented and confirmed that this to the appropriate level __________ for the fusion.  Large chunks of disk were taken out, marking it, and then continued removal of the facet on the  right side directly __________ with the disk.  The disk preparation was made using the cutters progressing up to an 11, angled curettes, straight curettes, straight and angled pituitaries, rasp, ring curettes.  Once endplates were well prepared, disk space was cleaned out.  Trial sizers showed 11 gave an excellent tight fit.  An 11 cage was packed with bone.  Bone was packed meticulously anterior to the cage position and there was some good bleeding of the endplates.  Using straight impactor, angle impactor, and a 7 mm trial as an impactor.  Once large amount of disk was __________ cage, cage was kicked over in a near parallel position. X-ray confirmed good position of the metal markers in the cage. Decortication of the transverse processes were performed after preparation and placement of the tap before placing each screw.  C-arm was used __________ checked under C-arm using the joystick advancing hand pressure through the pedicle, checking under fluoroscopy, feeling with the pedicle feeler, using the tap and then placement of the screw again checking fluoroscopic picture.  Before the screws put in, each transverse process was decorticated with 4 mm round bur.  Once all screws were placed, 40 mm rod was placed on the right side, 35 on the left.  Compression was performed on __________ first followed with the opposite left side compressing it maximally.  Bone was then meticulously passed over the transverse processes laterally.  Final spot pictures fluoroscopy was used to document position.  The patient tolerated the procedure well.  Standard layered closure with #1 Vicryl in the deep fascia, 2-0 on the subcutaneous tissue, subcuticular closure, Dermabond, postop dressing, and transferred to recovery room in stable condition. Instrument count and needle count was correct.     Ameila Weldon C. Ophelia CharterYates, M.D.     MCY/MEDQ  D:  02/28/2014  T:  03/01/2014  Job:  [id number]

## 2014-03-01 NOTE — Progress Notes (Signed)
Pt had temp of 102.2 at 1845. 650 mg Tylenol given and urine sent for UA and Cx. Notified Dr August Saucerean, MD on call. No new orders obtained at this time.

## 2014-03-01 NOTE — Evaluation (Signed)
Physical Therapy Evaluation Patient Details Name: Tammy Webster MRN: [id number] DOB: 03/25/1961 Today's Date: 03/01/2014 Time: 7829-5621 PT Time Calculation (min): 30 min  PT Assessment / Plan / Recommendation History of Present Illness  Pt s/p posterior lumbar fusion L4-5.  Clinical Impression  This patient presents with acute pain and decreased functional independence following the above mentioned procedure. At the time of PT eval, pt was able to perform transfers with good technique and maintenance of back precautions. This patient is appropriate for skilled PT interventions to address functional limitations, improve safety and independence with functional mobility, and return to PLOF.     PT Assessment  Patient needs continued PT services    Follow Up Recommendations  Outpatient PT (When appropriate per post-op protocol)    Does the patient have the potential to tolerate intense rehabilitation      Barriers to Discharge        Equipment Recommendations  Rolling walker with 5" wheels;3in1 (PT)    Recommendations for Other Services     Frequency Min 5X/week    Precautions / Restrictions Precautions Precautions: Back Precaution Booklet Issued: Yes (comment) Required Braces or Orthoses: Spinal Brace Spinal Brace: Lumbar corset;Applied in sitting position Restrictions Weight Bearing Restrictions: No   Pertinent Vitals/Pain Pt reports 5/10 pain throughout session, and reports receiving pain medication prior to session beginning.       Mobility  Bed Mobility Overal bed mobility: Needs Assistance Bed Mobility: Rolling;Sidelying to Sit Rolling: Supervision Sidelying to sit: Min guard General bed mobility comments: VC's for log roll technique. Pt able to perform well with min guard only to achieve full sitting on EOB. Transfers Overall transfer level: Needs assistance Equipment used: Rolling walker (2 wheeled) Transfers: Sit to/from Stand Sit to Stand:  Supervision General transfer comment: VC's for hand placement on seated surface for safety, as well as posture to maintain back precautions.  Ambulation/Gait Ambulation/Gait assistance: Min guard Ambulation Distance (Feet): 100 Feet Assistive device: Rolling walker (2 wheeled) Gait Pattern/deviations: Step-through pattern;Decreased stride length Gait velocity: Decreased Gait velocity interpretation: Below normal speed for age/gender General Gait Details: VC's for step-through gait pattern and fluidity of walker movement.     Exercises     PT Diagnosis: Difficulty walking;Acute pain  PT Problem List: Decreased strength;Decreased range of motion;Decreased activity tolerance;Decreased balance;Decreased mobility;Decreased knowledge of use of DME;Decreased safety awareness;Decreased knowledge of precautions;Pain PT Treatment Interventions: DME instruction;Gait training;Stair training;Functional mobility training;Therapeutic activities;Therapeutic exercise;Neuromuscular re-education;Patient/family education     PT Goals(Current goals can be found in the care plan section) Acute Rehab PT Goals Patient Stated Goal: To return home PT Goal Formulation: With patient/family Time For Goal Achievement: 03/08/14 Potential to Achieve Goals: Good  Visit Information  Last PT Received On: 03/01/14 Assistance Needed: +1 History of Present Illness: Pt s/p posterior lumbar fusion L4-5.       Prior Functioning  Home Living Family/patient expects to be discharged to:: Private residence Living Arrangements: Spouse/significant other Available Help at Discharge: Family;Available 24 hours/day Type of Home: House Home Access: Stairs to enter [employer] of Steps: 3 Entrance Stairs-Rails: None Home Layout: One level Home Equipment: None (raised toilet seat) Prior Function Level of Independence: Independent Comments: Working full time Communication Communication: No difficulties Dominant  Hand: Right    Cognition  Cognition Arousal/Alertness: Awake/alert Behavior During Therapy: WFL for tasks assessed/performed Overall Cognitive Status: Within Functional Limits for tasks assessed    Extremity/Trunk Assessment Upper Extremity Assessment Upper Extremity Assessment: Defer to OT evaluation Lower Extremity Assessment  Lower Extremity Assessment: LLE deficits/detail LLE Deficits / Details: Pt states L foot feels numb compared to the R Cervical / Trunk Assessment Cervical / Trunk Assessment: Normal   Balance Balance Overall balance assessment: Needs assistance Sitting-balance support: Feet supported Sitting balance-Leahy Scale: Fair Standing balance support: Bilateral upper extremity supported Standing balance-Leahy Scale: Fair  End of Session PT - End of Session Equipment Utilized During Treatment: Gait belt;Back brace Activity Tolerance: Patient tolerated treatment well Patient left: in chair;with call bell/phone within reach;with family/visitor present Nurse Communication: Mobility status  GP     Ruthann CancerHamilton, Tammy Webster 03/01/2014, 4:05 PM  Ruthann CancerLaura Hamilton, PT, DPT Acute Rehabilitation Services Pager: [phone]

## 2014-03-01 NOTE — Progress Notes (Signed)
Subjective: 1 Day Post-Op Procedure(s) (LRB): POSTERIOR LUMBAR FUSION 1 LEVEL (N/A) Patient reports pain as 7 on 0-10 scale.    Objective: Vital signs in last 24 hours: Temp:  [97.3 F (36.3 C)-98.3 F (36.8 C)] 98.3 F (36.8 C) (03/10 16100633) Pulse Rate:  [63-89] 79 (03/10 0633) Resp:  [6-18] 18 (03/10 0633) BP: (100-131)/(48-85) 104/48 mmHg (03/10 0633) SpO2:  [98 %-99 %] 99 % (03/10 96040633)  Intake/Output from previous day: 03/09 0701 - 03/10 0700 In: 4245 [P.O.:660; I.V.:3100; Blood:180; IV Piggyback:305] Out: 1625 [Urine:1225; Blood:400] Intake/Output this shift:     Recent Labs  03/01/14 0403  HGB 10.3*    Recent Labs  03/01/14 0403  WBC 9.0  RBC 3.34*  HCT 30.5*  PLT 207    Recent Labs  03/01/14 0403  NA 138  K 4.0  CL 102  CO2 24  BUN 12  CREATININE 0.62  GLUCOSE 115*  CALCIUM 8.2*   No results found for this basename: LABPT, INR,  in the last 72 hours  Neurologically intact has some numbness in right foot, side of TLIF.    EHL and AT , GS all all 5/5.  She is OOB in chair.   Assessment/Plan: 1 Day Post-Op Procedure(s) (LRB): POSTERIOR LUMBAR FUSION 1 LEVEL (N/A) Up with therapy, ambulation. Some N without V.   Likely home tomorrow. RN will switch percocet to Oxy IR so she can use q 3 hrs.   Keyanni Whittinghill C 03/01/2014, 8:19 AM

## 2014-03-01 NOTE — Progress Notes (Signed)
Occupational Therapy Evaluation and Discharge Patient Details Name: Tammy Webster MRN: [id number] DOB: 1961/03/18 Today's Date: 03/01/2014 Time: 1017-1050    OT Assessment / Plan / Recommendation History of present illness  Pt s/p posterior lumbar fusion 1 level   Clinical Impression   PTA pt lived at home with her husband and was Independent in ADLs and mobility. Completed all education and training for compensatory techniques for dressing incorporating back precautions into ADLs. Pt completed tub transfer using 3 in 1 and RW with min guard. Pt states that her husband will be home to assist with ADLs as needed. No further acute OT needs at this time. Acute OT to sign off.     OT Assessment    Pt does not need any further OT services.   Follow Up Recommendations    No OT Follow-up      Equipment Recommendations    3 N 1 BSC         Precautions / Restrictions Precautions Precautions: Back Precaution Booklet Issued: Yes (comment)       ADL   Eating/Feeding: Independent Where assessed: Chair Grooming: wash/dry hands; teeth care; Denture care; setup; supervision/safety Where assessed: Unsupported standing Upper Body Bathing: Supervision/safety, setup Where assessed: Unsupported sitting Lower Body Bathing: Minimal assistance Where assessed: Unsupported sit to stand Upper Body Dressing: Supervision (for back precautions) Where assessed: Unsupported sitting Lower Body Dressing: Moderate assistance Where assessed: Unsupported sit to stand Toilet Transfer: Education officer, communityupervision/ safety Toilet Transfer Method: sit to stand Toilet Transfer Equipment: Raised toilet seat with arms (3 in 1 over toilet) Toileting- Clothing Manipulation and Hygiene: Supervision Where assessed- Toileting clothing: Sit to stand from 3 in 1 toilet Tub/Shower transfer: Min guard Tub/Shower Transfer Method: Science writerAmbulating Tub/Shower Transfer Equipment: Other (3 in 1 in bathtub) Equipment used: Gait belt, [employer]olling  Walker       Visit Information  Assistance Needed: +1       Prior Functioning     Home Living Family/patient expects to be discharged to:: Private residence Living Arrangements: Spouse/significant other Available Help at Discharge: Family;Available 24 hours/day Type of Home: House Home Access: Stairs to enter [employer] CorporationEntrance Stairs-Number of Steps: 3 Entrance Stairs-Rails: None Home Layout: One level Home Equipment: None (raised toilet seat) Prior Function Level of Independence: Independent Comments: Working full time Communication Communication: No difficulties Dominant Hand: Right         Vision/Perception  No changes from baseline.   Cognition  Cognition Arousal/Alertness: Awake/alert Behavior During Therapy: WFL for tasks assessed/performed Overall Cognitive Status: Within Functional Limits for tasks assessed    Extremity/Trunk Assessment  Overall UE WFL for tasks assessed    Mobility  Overall bed mobility: Needs assistance Bed Mobility: Supine to sit Supine to sit: Supervision for back precautions Transfers Overall Transfer Level: Needs assistance Equipment used: Rolling walker, gait belt, back brace Transfers: Sit to/from stand Sit to stand: Supervision           End of Session  Equipment utilized during treatment: gait belt, rolling walker; back brace Activity tolerance: Patient tolerated treatment well Patient left: in chair, with call bell/phone within reach, with family members present Nurse Communication: Patient requests pain meds       Tammy Webster [phone] 03/01/2014, 3:00 PM

## 2014-03-02 LAB — URINE CULTURE
COLONY COUNT: NO GROWTH
Culture: NO GROWTH

## 2014-03-02 NOTE — Progress Notes (Signed)
Subjective: 2 Days Post-Op Procedure(s) (LRB): POSTERIOR LUMBAR FUSION 1 LEVEL (N/A) Patient reports pain as moderate.  Tried to get to BR this morning at 6 AM and had severe hip pain and was unable to walk. Used bedpan.   Objective: Vital signs in last 24 hours: Temp:  [98.5 F (36.9 C)-102.2 F (39 C)] 99.9 F (37.7 C) (03/11 0529) Pulse Rate:  [87-95] 95 (03/11 0529) Resp:  [18] 18 (03/11 0529) BP: (98-112)/(51-58) 98/53 mmHg (03/11 0529) SpO2:  [93 %-99 %] 93 % (03/11 0529) Weight:  [96.616 kg (213 lb)] 96.616 kg (213 lb) (03/10 2000)  Intake/Output from previous day: 03/10 0701 - 03/11 0700 In: 1200 [P.O.:1200] Out: 6 [Urine:6] Intake/Output this shift:     Recent Labs  03/01/14 0403  HGB 10.3*    Recent Labs  03/01/14 0403  WBC 9.0  RBC 3.34*  HCT 30.5*  PLT 207    Recent Labs  03/01/14 0403  NA 138  K 4.0  CL 102  CO2 24  BUN 12  CREATININE 0.62  GLUCOSE 115*  CALCIUM 8.2*   No results found for this basename: LABPT, INR,  in the last 72 hours  Neurologically intact  AT GS strong bilat.   Assessment/Plan: 2 Days Post-Op Procedure(s) (LRB): POSTERIOR LUMBAR FUSION 1 LEVEL (N/A) Up with therapy  Once she is safely mobile with ambulation she can be discharged.   Akela Pocius C 03/02/2014, 7:40 AM

## 2014-03-02 NOTE — Progress Notes (Addendum)
PT Cancellation Note  Patient Details Name: Tammy MoundDana W Mustin MRN: [id number] DOB: 10-08-1961   Cancelled Treatment:    Reason Eval/Treat Not Completed: Pain limiting ability to participate. Pt with mobility-limiting hip pain this morning, causing her to use the bedpan as she was unable to bear weight to walk to the bathroom. Pt up in chair upon therapy arrival. States she was hoping that sitting up would alleviate her pain, however it is unchanged. Declines any mobility with PT. Will attempt again this afternoon, time permitting.    1122: Checked back for second attempt, pt in bed stating she feels better but would like to just rest, and declined OOB.    Ruthann CancerHamilton, Terral Cooks 03/02/2014, 9:07 AM  Ruthann CancerLaura Hamilton, PT, DPT Acute Rehabilitation Services Pager: [phone]

## 2014-03-02 NOTE — Progress Notes (Signed)
Physical Therapy Treatment Patient Details Name: Tammy MoundDana W Elford MRN: [id number] DOB: [birth year] Today's Date: 03/02/2014 Time: [id number] PT Time Calculation (min): 12 min  PT Assessment / Plan / Recommendation  History of Present Illness Pt s/p posterior lumbar fusion L4-5.   PT Comments   Pain from earlier easing off and patient able to ambulate well this afternoon. Will need to attempt steps next sessoin  Follow Up Recommendations  Outpatient PT     Does the patient have the potential to tolerate intense rehabilitation     Barriers to Discharge        Equipment Recommendations  Rolling walker with 5" wheels;3in1 (PT)    Recommendations for Other Services    Frequency Min 5X/week   Progress towards PT Goals Progress towards PT goals: Progressing toward goals  Plan Current plan remains appropriate    Precautions / Restrictions     Pertinent Vitals/Pain no apparent distress     Mobility  Transfers Overall transfer level: Modified independent Ambulation/Gait Ambulation/Gait assistance: Supervision Ambulation Distance (Feet): 150 Feet Assistive device: Rolling walker (2 wheeled) Gait Pattern/deviations: Step-through pattern;Decreased stride length Gait velocity: Decreased General Gait Details: Patient with good posture and safe use of RW    Exercises     PT Diagnosis:    PT Problem List:   PT Treatment Interventions:     PT Goals (current goals can now be found in the care plan section)    Visit Information  Last PT Received On: 03/02/14 Assistance Needed: +1 History of Present Illness: Pt s/p posterior lumbar fusion L4-5.    Subjective Data      Cognition  Cognition Arousal/Alertness: Awake/alert Behavior During Therapy: WFL for tasks assessed/performed Overall Cognitive Status: Within Functional Limits for tasks assessed    Balance     End of Session PT - End of Session Equipment Utilized During Treatment: Gait belt;Back brace Activity Tolerance:  Patient tolerated treatment well Patient left: in chair;with family/visitor present Nurse Communication: Mobility status   GP     Fredrich BirksRobinette, Julia Elizabeth 03/02/2014, 2:54 PM 03/02/2014 Fredrich Birksobinette, Julia Elizabeth PTA [phone] pager [phone] office

## 2014-03-03 MED ORDER — LORAZEPAM 1 MG PO TABS: 1.0000 mg | ORAL_TABLET | Freq: Three times a day (TID) | ORAL | Status: DC | PRN

## 2014-03-03 MED ORDER — DSS 100 MG PO CAPS
100.0000 mg | ORAL_CAPSULE | Freq: Two times a day (BID) | ORAL | Status: DC
Start: 2014-03-03 — End: 2014-03-03

## 2014-03-03 MED ORDER — BISACODYL 10 MG RE SUPP: 10.0000 mg | Freq: Every day | RECTAL | Status: DC | PRN

## 2014-03-03 MED ORDER — MORPHINE SULFATE 2 MG/ML IJ SOLN: 1.0000 mg | INTRAMUSCULAR | Status: DC | PRN

## 2014-03-03 MED ORDER — SENNOSIDES-DOCUSATE SODIUM 8.6-50 MG PO TABS: 1.0000 | ORAL_TABLET | Freq: Every evening | ORAL | Status: DC | PRN

## 2014-03-03 MED ORDER — MENTHOL 3 MG MT LOZG: 1.0000 | LOZENGE | OROMUCOSAL | Status: DC | PRN

## 2014-03-03 MED ORDER — SODIUM CHLORIDE 0.9 % IV SOLN: 250.0000 mL | INTRAVENOUS | Status: DC

## 2014-03-03 MED ORDER — HYDROCODONE-ACETAMINOPHEN 5-325 MG PO TABS: 1.0000 | ORAL_TABLET | ORAL | Status: DC | PRN

## 2014-03-03 MED ORDER — METHOCARBAMOL 500 MG PO TABS: 500.0000 mg | ORAL_TABLET | Freq: Four times a day (QID) | ORAL | Status: DC | PRN

## 2014-03-03 MED ORDER — ACETAMINOPHEN 325 MG PO TABS: 650.0000 mg | ORAL_TABLET | ORAL | Status: DC | PRN

## 2014-03-03 MED ORDER — PANTOPRAZOLE SODIUM 40 MG PO TBEC: 40.0000 mg | DELAYED_RELEASE_TABLET | Freq: Every day | ORAL | Status: DC

## 2014-03-03 MED ORDER — ONDANSETRON HCL 4 MG/2ML IJ SOLN: 4.0000 mg | INTRAMUSCULAR | Status: DC | PRN

## 2014-03-03 MED ORDER — OXYCODONE HCL 5 MG PO TABS: 5.0000 mg | ORAL_TABLET | ORAL | Status: DC | PRN

## 2014-03-03 MED ORDER — SODIUM CHLORIDE 0.9 % IJ SOLN: 3.0000 mL | INTRAMUSCULAR | Status: DC | PRN

## 2014-03-03 MED ORDER — SODIUM CHLORIDE 0.9 % IJ SOLN: 3.0000 mL | Freq: Two times a day (BID) | INTRAMUSCULAR | Status: DC

## 2014-03-03 NOTE — Progress Notes (Addendum)
Subjective: 3 Days Post-Op Procedure(s) (LRB): POSTERIOR LUMBAR FUSION 1 LEVEL (N/A) Patient reports pain as moderate.    Objective: Vital signs in last 24 hours: Temp:  [97.8 F (36.6 C)-99.3 F (37.4 C)] 99.3 F (37.4 C) (03/12 0500) Pulse Rate:  [80-96] 80 (03/12 0500) Resp:  [18] 18 (03/12 0500) BP: (106-113)/(56-68) 113/68 mmHg (03/12 0500) SpO2:  [96 %-100 %] 100 % (03/12 0500)  Intake/Output from previous day: 03/11 0701 - 03/12 0700 In: 1800 [P.O.:1800] Out: 5 [Urine:5] Intake/Output this shift:     Recent Labs  03/01/14 0403  HGB 10.3*    Recent Labs  03/01/14 0403  WBC 9.0  RBC 3.34*  HCT 30.5*  PLT 207    Recent Labs  03/01/14 0403  NA 138  K 4.0  CL 102  CO2 24  BUN 12  CREATININE 0.62  GLUCOSE 115*  CALCIUM 8.2*   No results found for this basename: LABPT, INR,  in the last 72 hours  Neurologically intact lower extremities. Wound healing without drainage.  Assessment/Plan: 3 Days Post-Op Procedure(s) (LRB): POSTERIOR LUMBAR FUSION 1 LEVEL (N/A) Plan: discharge home today OV 2 weeks rx on chart  Anayansi Rundquist C 03/03/2014, 8:39 AM

## 2014-03-03 NOTE — Progress Notes (Signed)
Physical Therapy Treatment Patient Details Name: Tammy MoundDana W Webster MRN: [id number] DOB: 11-06-1961 Today's Date: 03/03/2014 Time: 1210-1223 PT Time Calculation (min): 13 min  PT Assessment / Plan / Recommendation  History of Present Illness Pt s/p posterior lumbar fusion L4-5.   PT Comments   Pt progressing towards physical therapy goals. Focus of session was stair training as pt anticipates d/c home this afternoon. Pt able to negotiate 3 steps backwards with RW and assist of husband. Feel pt will do well at home.   Follow Up Recommendations  Outpatient PT (When appropriate per post-op protocol)     Does the patient have the potential to tolerate intense rehabilitation     Barriers to Discharge        Equipment Recommendations  Rolling walker with 5" wheels;3in1 (PT)    Recommendations for Other Services    Frequency Min 5X/week   Progress towards PT Goals Progress towards PT goals: Progressing toward goals  Plan Current plan remains appropriate    Precautions / Restrictions Precautions Precautions: Back;Fall Precaution Booklet Issued: Yes (comment) Precaution Comments: Discussed 3/3 back precautions.  Required Braces or Orthoses: Spinal Brace Spinal Brace: Lumbar corset;Applied in sitting position Restrictions Weight Bearing Restrictions: No   Pertinent Vitals/Pain Pt received pain medication at beginning of session.     Mobility  Bed Mobility General bed mobility comments: Pt received exiting bathroom with husband.  Transfers Overall transfer level: Modified independent Transfers: Sit to/from Stand General transfer comment: Pt demonstrated proper hand placement and safety awareness.  Ambulation/Gait Ambulation/Gait assistance: Supervision Ambulation Distance (Feet): 150 Feet Assistive device: Rolling walker (2 wheeled) Gait Pattern/deviations: Step-through pattern;Decreased stride length Gait velocity: Decreased Gait velocity interpretation: Below normal speed for  age/gender General Gait Details: Patient with good posture and safe use of RW Stairs: Yes Stairs assistance: Min guard Stair Management: Backwards;With walker Number of Stairs: 3 General stair comments: Pt/family education on safety and technique with VC's for sequencing and hand placement.     Exercises     PT Diagnosis:    PT Problem List:   PT Treatment Interventions:     PT Goals (current goals can now be found in the care plan section) Acute Rehab PT Goals Patient Stated Goal: To return home PT Goal Formulation: With patient/family Time For Goal Achievement: 03/08/14 Potential to Achieve Goals: Good  Visit Information  Last PT Received On: 03/03/14 Assistance Needed: +1 Reason Eval/Treat Not Completed: Patient declined, no reason specified History of Present Illness: Pt s/p posterior lumbar fusion L4-5.    Subjective Data  Subjective: "I am ready to go home today." Patient Stated Goal: To return home   Cognition  Cognition Arousal/Alertness: Awake/alert Behavior During Therapy: WFL for tasks assessed/performed Overall Cognitive Status: Within Functional Limits for tasks assessed    Balance  Balance Overall balance assessment: No apparent balance deficits (not formally assessed)  End of Session PT - End of Session Equipment Utilized During Treatment: Back brace Activity Tolerance: Patient tolerated treatment well Patient left: in chair;with family/visitor present Nurse Communication: Mobility status   GP     Tammy CancerHamilton, Tammy Webster 03/03/2014, 1:05 PM  Tammy CancerLaura Webster, PT, DPT Acute Rehabilitation Services Pager: [phone]

## 2014-03-03 NOTE — Care Management Note (Signed)
CARE MANAGEMENT NOTE 03/03/2014  Patient:  Tammy Webster,Tammy W   Account Number:  [id number]  Date Initiated:  03/01/2014  Documentation initiated by:  Vance PeperBRADY,Carlyne Keehan  Subjective/Objective Assessment:   53 yr old female s/p L5-S1 TLIF, L4-5 decompression.     Action/Plan:   No home health or DME needs identified. Patient to discharge home with family.   Anticipated DC Date:  03/03/2014   Anticipated DC Plan:  HOME/SELF CARE      DC Planning Services  CM consult      PAC Choice  NA   Choice offered to / List presented to:     DME arranged  NA        HH arranged  NA      Status of service:  Completed, signed off Medicare Important Message given?   (If response is "NO", the following Medicare IM given date fields will be blank) Date Medicare IM given:   Date Additional Medicare IM given:    Discharge Disposition:  HOME/SELF CARE  Per UR Regulation:

## 2014-03-03 NOTE — Progress Notes (Signed)
PT Cancellation Note  Patient Details Name: Tammy Webster MRN: [id number] DOB: 07-Feb-1961   Cancelled Treatment:    Reason Eval/Treat Not Completed: Patient declined, no reason specified. Patient stated that she had just returned to bed after ambulating with her husband. Declined practicing steps at this time. Will attempt later this afternoon as time allows   Fredrich BirksRobinette, Julia Elizabeth 03/03/2014, 10:37 AM

## 2014-03-28 NOTE — Discharge Summary (Signed)
Physician Discharge Summary  Patient ID: TOMMY MINICHIELLO MRN: [id number] DOB/AGE: 1961-01-23 53 y.o.  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   Past Medical History  Diagnosis Date  . Chronic constipation   . MRSA infection     2007-2009  . Helicobacter pylori gastritis 04/13/12    treated with antibiotic  . Pneumonia     hx of many yrs ago   . History of bronchitis     time unknown when it was  . URI (upper respiratory infection)     week after Christmas and treated with antibiotic  . Flu     in Dec 2014  . Headache(784.0)   . Weakness     numbness and tingling occ to both legs and right arm  . Chronic back pain     stenosis and buldging disc  . Arthritis     back and shoulder and neck  . Gastric ulcer   . Anemia     hx of-states when she was younger  . Insomnia     takes Ativan daily as needed    Surgeries: Procedure(s): POSTERIOR LUMBAR FUSION 1 LEVEL on 02/28/2014 L4-5 decompression. L5-S1 Gill procedure. Right L5-S1 TLIF with  11 mm peek/carbon body cage lordotic DePuy. Local interbody bone,  bilateral lateral gutter fusion. Pedicle instrumentation. Synthes  matrix 6 x 40 mm screws at L5 and S1, with 40 mm rod on the right, 35 mm  rod on the left (Interbody cage is DePuy Concorde bullet).   Consultants (if any):  none  Discharged Condition: Improved  Hospital Course: GENESSA BEMAN is an 53 y.o. female who was admitted 02/28/2014 with a diagnosis of Spinal stenosis, lumbar region, with neurogenic claudication and went to the operating room on 02/28/2014 and underwent the above named procedures.    She was given  perioperative antibiotics:      Anti-infectives   Start     Dose/Rate Route Frequency Ordered Stop   02/28/14 1830  ceFAZolin (ANCEF) IVPB 1 g/50 mL premix     1 g 100 mL/hr over 30 Minutes Intravenous Every 8 hours 02/28/14 1815 03/01/14 1104   02/28/14 0600  ceFAZolin (ANCEF) IVPB 2 g/50 mL premix     2 g 100 mL/hr over 30 Minutes Intravenous On call to O.R. 02/27/14 1419 02/28/14 1245    .  She was given sequential compression devices, early ambulation, and aspirin for DVT prophylaxis.  She benefited maximally from the hospital stay and there were no complications.    Recent vital signs:  Filed Vitals:   03/03/14 0500  BP: 113/68  Pulse: 80  Temp: 99.3 F (37.4 C)  Resp: 18    Recent laboratory studies:  Lab Results  Component Value Date   HGB 10.3* 03/01/2014   HGB 13.4 02/21/2014   HGB 12.9 03/11/2012   Lab Results  Component Value Date   WBC 9.0 03/01/2014   PLT 207 03/01/2014   No results found for this basename: INR   Lab Results  Component Value Date   NA 138 03/01/2014   K 4.0 03/01/2014   CL 102 03/01/2014   CO2 24 03/01/2014   BUN 12 03/01/2014   CREATININE 0.62 03/01/2014  GLUCOSE 115* 03/01/2014    Discharge Medications:     Medication List         LORazepam 1 MG tablet  Commonly known as:  ATIVAN  Take 1 mg by mouth every 8 (eight) hours as needed for anxiety.     LORazepam 1 MG tablet  Commonly known as:  ATIVAN  Take 1 tablet (1 mg total) by mouth every 8 (eight) hours as needed for anxiety.     methocarbamol 500 MG tablet  Commonly known as:  ROBAXIN  Take 1 tablet (500 mg total) by mouth every 6 (six) hours as needed for muscle spasms (spasm).     methocarbamol 500 MG tablet  Commonly known as:  ROBAXIN  Take 1 tablet (500 mg total) by mouth every 6 (six) hours as needed for muscle spasms.     oxyCODONE-acetaminophen 5-325 MG per tablet  Commonly known as:  ROXICET  Take 1-2 tablets by mouth every 4 (four) hours as needed.         Diagnostic Studies: Dg Lumbar Spine 2-3 Views  02/28/2014   CLINICAL DATA:  Spinal stenosis.  EXAM: LUMBAR SPINE - 2-3 VIEW; DG C-ARM 61-120 MIN  COMPARISON:  MRI dated 03/04/2013  FINDINGS: AP and lateral C-arm images demonstrate the patient has undergone interbody and posterior fusion at L5-S1. Hardware appears in good position in the AP and lateral projections.  IMPRESSION: Fusion performed at L5-S1.   Electronically Signed   By: Geanie CooleyJim  Maxwell M.D.   On: 02/28/2014 16:15   Dg C-arm 61-120 Min  02/28/2014   CLINICAL DATA:  Spinal stenosis.  EXAM: LUMBAR SPINE - 2-3 VIEW; DG C-ARM 61-120 MIN  COMPARISON:  MRI dated 03/04/2013  FINDINGS: AP and lateral C-arm images demonstrate the patient has undergone interbody and posterior fusion at L5-S1. Hardware appears in good position in the AP and lateral projections.  IMPRESSION: Fusion performed at L5-S1.   Electronically Signed   By: Geanie CooleyJim  Maxwell M.D.   On: 02/28/2014 16:15    Disposition: 01-Home or Self Care  DISCHARGE INSTRUCTIONS:Call if there is increasing drainage, fever greater than 101.5, severe head aches, and worsening nausea or light sensitivity. If shortness of breath, bloody cough or chest tightness or pain go to an emergency room. No lifting greater than 10 lbs. Avoid bending, stooping and twisting. Use brace when sitting and out of bed even to go to bathroom. Walk in house for first 2 weeks then may start to get out slowly increasing distances up to one mile by 4-6 weeks post op. After 5 days may shower and change dressing following bathing with shower.When bathing remove the brace shower and replace brace before getting out of the shower. If drainage, keep dry dressing and do not bathe the incision, use an moisture impervious dressing. Please call and return for scheduled follow up appointment 2 weeks from the time of surgery.    Follow-up Information   Follow up with Eldred MangesYATES,MARK C, MD. Schedule an appointment as soon as possible  for a visit in 2 weeks.   Specialty:  Orthopedic Surgery   Contact information:   [address] [place] [address] [phone]        Signed: Wende NeighborsVERNON,Mar Zettler M 03/28/2014, 11:40 AM

## 2014-04-25 NOTE — Telephone Encounter (Signed)
Encounter Closed---04/25/14 TP 

## 2014-08-09 ENCOUNTER — Ambulatory Visit: Payer: BC Managed Care – PPO | Admitting: Internal Medicine

## 2014-08-22 ENCOUNTER — Ambulatory Visit (INDEPENDENT_AMBULATORY_CARE_PROVIDER_SITE_OTHER): Payer: BC Managed Care – PPO | Admitting: Gastroenterology

## 2014-08-22 ENCOUNTER — Encounter: Payer: Self-pay | Admitting: Gastroenterology

## 2014-08-22 VITALS — BP 132/89 | HR 68 | Temp 97.6°F | Ht 65.0 in | Wt 223.8 lb

## 2014-08-22 DIAGNOSIS — K219 Gastro-esophageal reflux disease without esophagitis: Secondary | ICD-10-CM

## 2014-08-22 DIAGNOSIS — R1013 Epigastric pain: Secondary | ICD-10-CM

## 2014-08-22 MED ORDER — PANTOPRAZOLE SODIUM 40 MG PO TBEC: 40.0000 mg | DELAYED_RELEASE_TABLET | Freq: Every day | ORAL | Status: DC

## 2014-08-22 NOTE — Patient Instructions (Signed)
1. Stop Nexium. After two weeks off of Nexium, collect stool for H.pylori. You may take Zantac or TUMS during this period of time but nothing else. No antibiotics while waiting to collect stool.  2. Once you collect your specimen. Start pantoprazole  daily. RX sent to your pharmacy. 3. Cut back on aspirin powders. If you continue to have abdominal pain after two weeks on pantoprazole, please let me know.

## 2014-08-22 NOTE — Progress Notes (Signed)
Primary Care Physician: Kirk Ruths, MD  Primary Gastroenterologist:  Roetta Sessions, MD   Chief Complaint  Patient presents with  . Heartburn    HPI: Tammy Webster is a 53 y.o. female here for followup. She was last seen in July 2013. Treated for H. pylori gastritis back in 2013, discovered at time of EGD. Confirmed eradication via H. pylori stool antigen July 2013. She had back surgery back in March of this year.  Nocturnal heartburn. Frequent heartburn associated with vomiting. Nexium  daily. Has been on this for 3 weeks. Had been on PPI for several weeks with no improvement. Takes BC powders twice daily for headache and back pain. Epigastric pain can wake up with it and sometimes worse with meals. BM alternating constipation/diarrhea. Some postprandial diarrhea. Senna if needed. No melena, brbpr. Only other PPI has been omeprazole which she did not fill worked adequately.   Current Outpatient Prescriptions  Medication Sig Dispense Refill  . Aspirin-Salicylamide-Caffeine (BC HEADACHE PO) Take 1 Package by mouth 2 (two) times daily.      Marland Kitchen esomeprazole (NEXIUM) 20 MG capsule Take 20 mg by mouth daily at 12 noon.      Marland Kitchen LORazepam (ATIVAN) 1 MG tablet Take 1 mg by mouth every 8 (eight) hours as needed for anxiety.       .         No current facility-administered medications for this visit.    Allergies as of 08/22/2014  . (No Known Allergies)   Past Medical History  Diagnosis Date  . Chronic constipation   . MRSA infection     2007-2009  . Helicobacter pylori gastritis 04/13/12    treated with antibiotic  . Pneumonia     hx of many yrs ago   . History of bronchitis     time unknown when it was  . URI (upper respiratory infection)     week after Christmas and treated with antibiotic  . Flu     in Dec 2014  . Headache(784.0)   . Weakness     numbness and tingling occ to both legs and right arm  . Chronic back pain     stenosis and buldging disc  .  Arthritis     back and shoulder and neck  . Gastric ulcer   . Anemia     hx of-states when she was younger  . Insomnia     takes Ativan daily as needed   Past Surgical History  Procedure Laterality Date  . Carpal tunnel release Bilateral   . Gallbladder surgery  2004  . Tubal ligation    . Savory dilation  04/13/2012  . Maloney dilation  04/13/2012    9F  . Esophagogastroduodenoscopy  04/13/12    Rourk-normal esophaus s/p dilation/h pylori gastritis  . Colonoscopy  04/13/12    Rourk-anal canal hemorrhoids/normal colon,rectum    ROS:  General: Negative for anorexia, weight loss, fever, chills, fatigue, weakness. ENT: Negative for hoarseness, difficulty swallowing , nasal congestion. CV: Negative for chest pain, angina, palpitations, dyspnea on exertion, peripheral edema.  Respiratory: Negative for dyspnea at rest, dyspnea on exertion, cough, sputum, wheezing.  GI: See history of present illness. GU:  Negative for dysuria, hematuria, urinary incontinence, urinary frequency, nocturnal urination.  Endo: Negative for unusual weight change.    Physical Examination:   BP 132/89  Pulse 68  Temp(Src) 97.6 F (36.4 C) (Oral)  Ht  (1.651 m)  Wt 223 lb 12.8  oz (101.515 kg)  BMI 37.24 kg/m2  General: Well-nourished, well-developed in no acute distress.  Eyes: No icterus. Mouth: Oropharyngeal mucosa moist and pink , no lesions erythema or exudate. Lungs: Clear to auscultation bilaterally.  Heart: Regular rate and rhythm, no murmurs rubs or gallops.  Abdomen: Bowel sounds are normal, mild to moderate epigastric tenderness, nondistended, no hepatosplenomegaly or masses, no abdominal bruits or hernia , no rebound or guarding.   Extremities: No lower extremity edema. No clubbing or deformities. Neuro: Alert and oriented x 4   Skin: Warm and dry, no jaundice.   Psych: Alert and cooperative, normal mood and affect.

## 2014-08-22 NOTE — Assessment & Plan Note (Signed)
Recent recurrence of epigastric pain, refractory GERD. Diagnosed with H. pylori gastritis in 2013. Stool antigen negative in 2013 showing eradication. Takes BC powders couple times per day. Back on Nexium 20 mg daily without significant improvement in the near for symptoms. Suspect GERD, possible gastritis versus ulcer related to aspirin powders. She is adamant about taking for H. pylori again. 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 H. pylori stool antigen planned off a PPI for 2 weeks. No antibiotics or bismuth products during this time frame either. She may take TUMS or Zantac as needed. 1 she is collected her stool specimen she'll start pantoprazole 40 mg daily. Recommend she cut back on her BC powders. If no improvement in her symptoms after being on protonix for 2 weeks she will let me know. At that time may consider repeat endoscopy.

## 2014-08-24 NOTE — Progress Notes (Signed)
Cc to pcp °

## 2015-01-03 ENCOUNTER — Telehealth: Payer: Self-pay | Admitting: Internal Medicine

## 2015-01-03 NOTE — Telephone Encounter (Signed)
Close encounter 

## 2015-01-12 ENCOUNTER — Telehealth: Payer: Self-pay | Admitting: Internal Medicine

## 2015-01-12 NOTE — Telephone Encounter (Signed)
Close encounter 

## 2015-01-13 ENCOUNTER — Ambulatory Visit: Payer: Self-pay | Admitting: Internal Medicine

## 2015-07-31 ENCOUNTER — Other Ambulatory Visit (HOSPITAL_COMMUNITY): Payer: Self-pay | Admitting: Internal Medicine

## 2015-07-31 DIAGNOSIS — Z1231 Encounter for screening mammogram for malignant neoplasm of breast: Secondary | ICD-10-CM

## 2015-08-14 ENCOUNTER — Ambulatory Visit (HOSPITAL_COMMUNITY)
Admission: RE | Admit: 2015-08-14 | Discharge: 2015-08-14 | Disposition: A | Discharge status: Home / Self Care | Payer: BLUE CROSS/BLUE SHIELD | Source: Ambulatory Visit | Attending: Internal Medicine | Admitting: Internal Medicine

## 2015-08-14 DIAGNOSIS — Z1231 Encounter for screening mammogram for malignant neoplasm of breast: Secondary | ICD-10-CM | POA: Diagnosis present

## 2015-08-21 ENCOUNTER — Other Ambulatory Visit: Payer: Self-pay | Admitting: Internal Medicine

## 2015-08-21 DIAGNOSIS — R928 Other abnormal and inconclusive findings on diagnostic imaging of breast: Secondary | ICD-10-CM

## 2015-08-29 ENCOUNTER — Ambulatory Visit (HOSPITAL_COMMUNITY)
Admission: RE | Admit: 2015-08-29 | Discharge: 2015-08-29 | Disposition: A | Discharge status: Home / Self Care | Payer: BLUE CROSS/BLUE SHIELD | Source: Ambulatory Visit | Attending: Internal Medicine | Admitting: Internal Medicine

## 2015-08-29 ENCOUNTER — Other Ambulatory Visit (HOSPITAL_COMMUNITY): Payer: Self-pay | Admitting: Family Medicine

## 2015-08-29 ENCOUNTER — Ambulatory Visit (HOSPITAL_COMMUNITY)
Admission: RE | Admit: 2015-08-29 | Discharge: 2015-08-29 | Disposition: A | Discharge status: Home / Self Care | Payer: BLUE CROSS/BLUE SHIELD | Source: Ambulatory Visit | Attending: Family Medicine | Admitting: Family Medicine

## 2015-08-29 DIAGNOSIS — R928 Other abnormal and inconclusive findings on diagnostic imaging of breast: Secondary | ICD-10-CM | POA: Insufficient documentation

## 2015-08-29 DIAGNOSIS — N6310 Unspecified lump in the right breast, unspecified quadrant: Secondary | ICD-10-CM

## 2015-08-29 DIAGNOSIS — N6001 Solitary cyst of right breast: Secondary | ICD-10-CM | POA: Insufficient documentation

## 2015-09-07 ENCOUNTER — Encounter: Payer: Self-pay | Admitting: Advanced Practice Midwife

## 2015-09-07 ENCOUNTER — Other Ambulatory Visit (HOSPITAL_COMMUNITY)
Admission: RE | Admit: 2015-09-07 | Discharge: 2015-09-07 | Disposition: A | Discharge status: Home / Self Care | Payer: BLUE CROSS/BLUE SHIELD | Source: Ambulatory Visit | Attending: Advanced Practice Midwife | Admitting: Advanced Practice Midwife

## 2015-09-07 ENCOUNTER — Ambulatory Visit (INDEPENDENT_AMBULATORY_CARE_PROVIDER_SITE_OTHER): Payer: BLUE CROSS/BLUE SHIELD | Admitting: Advanced Practice Midwife

## 2015-09-07 VITALS — BP 120/76 | Ht 65.0 in | Wt 208.0 lb

## 2015-09-07 DIAGNOSIS — Z1151 Encounter for screening for human papillomavirus (HPV): Secondary | ICD-10-CM | POA: Diagnosis present

## 2015-09-07 DIAGNOSIS — Z01419 Encounter for gynecological examination (general) (routine) without abnormal findings: Secondary | ICD-10-CM

## 2015-09-07 MED ORDER — FLUCONAZOLE 150 MG PO TABS: ORAL_TABLET | ORAL | Status: DC

## 2015-09-07 NOTE — Progress Notes (Signed)
Tammy Webster 54 y.o.  Filed Vitals:   09/07/15 1536  BP: 120/76     Past Medical History: Past Medical History  Diagnosis Date  . Chronic constipation   . MRSA infection     2007-2009  . Helicobacter pylori gastritis 04/13/12    treated with antibiotic  . Pneumonia     hx of many yrs ago   . History of bronchitis     time unknown when it was  . URI (upper respiratory infection)     week after Christmas and treated with antibiotic  . Flu     in Dec 2014  . Headache(784.0)   . Weakness     numbness and tingling occ to both legs and right arm  . Chronic back pain     stenosis and buldging disc  . Arthritis     back and shoulder and neck  . Gastric ulcer   . Anemia     hx of-states when she was younger  . Insomnia     takes Ativan daily as needed    Past Surgical History: Past Surgical History  Procedure Laterality Date  . Carpal tunnel release Bilateral   . Gallbladder surgery  2003/05/17  . Tubal ligation    . Savory dilation  04/13/2012  . Maloney dilation  04/13/2012    35F  . Esophagogastroduodenoscopy  04/13/12    Rourk-normal esophaus s/p dilation/h pylori gastritis  . Colonoscopy  04/13/12    Rourk-anal canal hemorrhoids/normal colon,rectum    Family History: Family History  Problem Relation Age of Onset  . Colon polyps Father     heart disease - stenting, "shocked heart twice"  . Gastric cancer Father     deceased 16-May-2012  . Liver disease Neg Hx   . Colon cancer Neg Hx   . Heart disease Maternal Grandmother 45  . Breast cancer Maternal Grandmother 62  . Heart disease Maternal Grandfather 60    also MI  . Heart disease Paternal Grandmother 24  . Diabetes Paternal Grandmother   . Heart disease Paternal Grandfather 14  . Heart failure Paternal Grandfather     enlarged heart    Social History: Social History  Substance Use Topics  . Smoking status: Former Smoker -- 2.00 packs/day for 30 years    Types: Cigarettes  . Smokeless tobacco: Never Used  .  Alcohol Use: No    Allergies: No Known Allergies    Current outpatient prescriptions:  .  Aspirin-Salicylamide-Caffeine (BC HEADACHE PO), Take 1 Package by mouth 2 (two) times daily., Disp: , Rfl:  .  LORazepam (ATIVAN) 1 MG tablet, Take 1 mg by mouth every 8 (eight) hours as needed for anxiety. , Disp: , Rfl:  .  fluconazole (DIFLUCAN) 150 MG tablet, 1 po stat; repeat in 3 days, Disp: 2 tablet, Rfl: 2  History of Present Illness: Here for pap. Last pap 2012-05-16.  LMP 6 years ago.  Hot flashes are rough now.  PCP watches labs.  "cholesterol a tad high". Has started red yeast rice. Mammogram/US last week normal. Colonoscopy/EGD 3 years ago normal.    Review of Systems   Patient denies any headaches, blurred vision, shortness of breath, chest pain, abdominal pain, problems with bowel movements, urination, or intercourse.   Physical Exam: General:  Well developed, well nourished, no acute distress Skin:  Warm and dry Neck:  Midline trachea, normal thyroid Lungs; Clear to auscultation bilaterally Breast:  Deferred per request d/t recent mammogram Cardiovascular: Regular rate  and rhythm Abdomen:  Soft, non tender, no hepatosplenomegaly Pelvic:  External genitalia is normal in appearance.  The vagina is normal in appearance.  The cervix is bulbous.  Uterus is felt to be normal size, shape, and contour.  No adnexal masses or tenderness noted. Exam limited by habitus Extremities:  No swelling or varicosities noted Psych:  No mood changes.     Impression: normal gyn exam Vasomotor sx     Plan: If pap noraml, repeat 3 years Try pycnogenol  X 1 week, then increase to  /day for hot flashes.

## 2015-09-07 NOTE — Patient Instructions (Signed)
Pycnogenol:  /day for 1 week then increase to  /day for hot flashes

## 2015-09-11 LAB — CYTOLOGY - PAP

## 2016-02-03 NOTE — Progress Notes (Signed)
REVIEWED-NO ADDITIONAL RECOMMENDATIONS. 

## 2016-05-09 ENCOUNTER — Other Ambulatory Visit (HOSPITAL_COMMUNITY): Payer: Self-pay | Admitting: Internal Medicine

## 2016-05-09 DIAGNOSIS — N6459 Other signs and symptoms in breast: Secondary | ICD-10-CM

## 2016-05-21 ENCOUNTER — Encounter (HOSPITAL_COMMUNITY): Payer: BLUE CROSS/BLUE SHIELD

## 2016-05-28 ENCOUNTER — Encounter (HOSPITAL_COMMUNITY): Payer: BLUE CROSS/BLUE SHIELD

## 2016-06-04 ENCOUNTER — Ambulatory Visit (HOSPITAL_COMMUNITY)
Admission: RE | Admit: 2016-06-04 | Discharge: 2016-06-04 | Disposition: A | Discharge status: Home / Self Care | Payer: BLUE CROSS/BLUE SHIELD | Source: Ambulatory Visit | Attending: Internal Medicine | Admitting: Internal Medicine

## 2016-06-04 DIAGNOSIS — N6459 Other signs and symptoms in breast: Secondary | ICD-10-CM

## 2016-09-17 ENCOUNTER — Encounter: Payer: Self-pay | Admitting: Cardiology

## 2016-10-17 ENCOUNTER — Encounter: Payer: Self-pay | Admitting: Cardiology

## 2016-10-17 NOTE — Progress Notes (Signed)
Cardiology Office Note  Date: 10/18/2016   ID: Tammy Webster, DOB [birth year], MRN [id number]  PCP: Cassell SmilesFUSCO,LAWRENCE J., MD  Primary Cardiologist: Nona DellSamuel Tomika Eckles, MD   Chief Complaint  Patient presents with  . Chest Pain  . Shortness of Breath    History of Present Illness: Tammy Webster is a 55 y.o. female referred for cardiology consultation by Dr. Sherwood GamblerFusco for the evaluation of chest pain and shortness of breath. She states that she went on a cruise back in September and was experiencing what she felt like was prolonged episodes of indigestion. Also short of breath, but she reports that this has been more of a long-standing issue. She uses as needed Lasix prescribed by Dr. Phillips OdorGolding for intermittent leg edema. She is not on any standing antihypertensive medications.  Record review finds prior evaluation by Dr. Rennis GoldenHilty back in February 2015. She underwent overall reassuring cardiac testing at that time including a normal Myoview and echocardiogram demonstrating normal LVEF and grade 2 diastolic dysfunction. She has not undergone follow-up ischemic testing. I did review her recent ECG which showed nonspecific T-wave changes. Main cardiac risk factors include hyperlipidemia and obesity.  Past Medical History:  Diagnosis Date  . Anxiety   . Arthritis   . Chronic back pain   . Chronic constipation   . Gastric ulcer   . Headache(784.0)   . Helicobacter pylori gastritis    Treated 2013  . History of anemia   . History of bronchitis   . History of pneumonia   . Hyperlipidemia   . Insomnia   . MRSA infection    2007-2009    Past Surgical History:  Procedure Laterality Date  . CARPAL TUNNEL RELEASE Bilateral   . COLONOSCOPY  04/13/12   Rourk-anal canal hemorrhoids/normal colon,rectum  . ESOPHAGOGASTRODUODENOSCOPY  04/13/12   Rourk-normal esophaus s/p dilation/h pylori gastritis  . GALLBLADDER SURGERY  2004  . MALONEY DILATION  04/13/2012   20F  . SAVORY DILATION  04/13/2012  . TUBAL  LIGATION      Current Outpatient Prescriptions  Medication Sig Dispense Refill  . Aspirin-Salicylamide-Caffeine (BC HEADACHE POWDER PO) Take by mouth daily as needed.     Marland Kitchen. LORazepam (ATIVAN) 1 MG tablet Take 1 mg by mouth every 8 (eight) hours as needed for anxiety.      No current facility-administered medications for this visit.    Allergies:  Review of patient's allergies indicates no known allergies.   Social History: The patient  reports that she has quit smoking. Her smoking use included Cigarettes. She has a 60.00 pack-year smoking history. She has never used smokeless tobacco. She reports that she does not drink alcohol or use drugs.   Family History: The patient's family history includes Breast cancer (age of onset: 6885) in her maternal grandmother; Colon polyps in her father; Diabetes in her paternal grandmother; Gastric cancer in her father; Heart disease (age of onset: 3445) in her maternal grandmother; Heart disease (age of onset: 7656) in her paternal grandfather; Heart disease (age of onset: 6860) in her maternal grandfather; Heart disease (age of onset: 3065) in her paternal grandmother; Heart failure in her paternal grandfather.   ROS:  Please see the history of present illness. Otherwise, complete review of systems is positive for arthritis symptoms.  All other systems are reviewed and negative.   Physical Exam: VS:  BP 136/78 (BP Location: Right Arm)   Pulse 81   Ht 5\' 5"  (1.651 m)   Wt 236 lb (107  kg)   SpO2 97%   BMI 39.27 kg/m , BMI Body mass index is 39.27 kg/m.  Wt Readings from Last 3 Encounters:  10/18/16 236 lb (107 kg)  09/07/15 208 lb (94.3 kg)  08/22/14 223 lb 12.8 oz (101.5 kg)    General: Overweight woman, appears comfortable at rest. HEENT: Conjunctiva and lids normal, oropharynx clear. Neck: Supple, no elevated JVP or carotid bruits, no thyromegaly. Lungs: Clear to auscultation, nonlabored breathing at rest. Cardiac: Regular rate and rhythm, no S3 or  significant systolic murmur, no pericardial rub. Abdomen: Soft, nontender, bowel sounds present, no guarding or rebound. Extremities: Trace ankle edema, distal pulses 2+. Skin: Warm and dry. Musculoskeletal: No kyphosis. Neuropsychiatric: Alert and oriented x3, affect grossly appropriate.  ECG: I personally reviewed the tracing from 09/16/2016 which showed normal sinus rhythm with nonspecific T-wave changes in lead motion artifact.  Recent Labwork:  May 2017: Hemoglobin 13.7, platelets 284, BUN 14, crit creatinine 0.7, potassium 4.2, AST 15, ALT 20, hemoglobin A1c 5.3, TSH 2.35 August 2016: Cholesterol 223, HDL 53, LDL 148, triglycerides 108  Other Studies Reviewed Today:  Echocardiogram 02/16/2014: Study Conclusions  - Left ventricle: The cavity size was normal. Wall thickness was increased in a pattern of mild LVH. Systolic function was normal. The estimated ejection fraction was in the range of 55% to 60%. Wall motion was normal; there were no regional wall motion abnormalities. Features are consistent with a pseudonormal left ventricular filling pattern, with concomitant abnormal relaxation and increased filling pressure (grade 2 diastolic dysfunction). - Aortic valve: There was no stenosis. - Mitral valve: Mild regurgitation. - Left atrium: The atrium was mildly dilated. - Right ventricle: The cavity size was normal. Systolic function was normal. - Pulmonary arteries: No complete TR doppler jet so unable to estimate PA systolic pressure. - Inferior vena cava: The vessel was normal in size; the respirophasic diameter changes were in the normal range (= 50%); findings are consistent with normal central venous pressure. Impressions:  - Normal LV size and systolic function, EF 55-60%. Mild LV hypertrophy. Moderate diastolic dysfunction. Normal RV size and systolic function. No significant valvular abnormalities.  Lexiscan Myoview  02/03/2014: Impression Exercise Capacity:  Lexiscan with no exercise. BP Response:  Hypertensive blood pressure response. Clinical Symptoms:  No significant symptoms noted. ECG Impression:  No significant ECG changes with Lexiscan. Comparison with Prior Nuclear Study: No previous nuclear study performed  Overall Impression:  Low risk stress nuclear study with area of breast attenuation artifact, worse at rest than stress.  LV Wall Motion:  NL LV Function; NL Wall Motion; EF 69%.  Assessment and Plan:  1. Precordial pain described as a feeling of indigestion and also dyspnea on exertion. Main cardiac risk factors include obesity and hyperlipidemia. She has a prior history of tobacco use. Last ischemic workup was in 2015. She does have diastolic dysfunction which could be contributing. At this point we will plan a follow-up echocardiogram to reassess cardiac structure and function, she might benefit from a standing diuretic such as chlorthalidone. Will also reassess for ischemia with exercise Myoview.  2. Hyperlipidemia, currently not on medical therapy. LDL 148 last year.  3. Previous history of tobacco use, in remission.  4. Obesity, we did discuss weight loss today.  Current medicines were reviewed with the patient today.   Orders Placed This Encounter  Procedures  . NM Myocar Multi W/Spect W/Wall Motion / EF  . ECHOCARDIOGRAM COMPLETE    Disposition: Call with test results.  Signed, Remi Deter  Franky Macho, MD, [facility] 10/18/2016 11:44 AM    [facility] at [facility] [address] [place], [place], [place] [address] Phone: [phone]; Fax: [phone]

## 2016-10-18 ENCOUNTER — Encounter: Payer: Self-pay | Admitting: Cardiology

## 2016-10-18 ENCOUNTER — Ambulatory Visit (INDEPENDENT_AMBULATORY_CARE_PROVIDER_SITE_OTHER): Payer: BLUE CROSS/BLUE SHIELD | Admitting: Cardiology

## 2016-10-18 VITALS — BP 136/78 | HR 81 | Ht 65.0 in | Wt 236.0 lb

## 2016-10-18 DIAGNOSIS — E782 Mixed hyperlipidemia: Secondary | ICD-10-CM | POA: Diagnosis not present

## 2016-10-18 DIAGNOSIS — R072 Precordial pain: Secondary | ICD-10-CM

## 2016-10-18 DIAGNOSIS — I519 Heart disease, unspecified: Secondary | ICD-10-CM

## 2016-10-18 DIAGNOSIS — F17201 Nicotine dependence, unspecified, in remission: Secondary | ICD-10-CM | POA: Diagnosis not present

## 2016-10-18 DIAGNOSIS — R0602 Shortness of breath: Secondary | ICD-10-CM | POA: Diagnosis not present

## 2016-10-18 DIAGNOSIS — I5189 Other ill-defined heart diseases: Secondary | ICD-10-CM

## 2016-10-18 NOTE — Patient Instructions (Signed)
Your physician recommends that you schedule a follow-up appointment in:  To be determined after tests   Your physician has requested that you have an echocardiogram. Echocardiography is a painless test that uses sound waves to create images of your heart. It provides your doctor with information about the size and shape of your heart and how well your heart's chambers and valves are working. This procedure takes approximately one hour. There are no restrictions for this procedure.     Your physician has requested that you have en exercise stress myoview. For further information please visit [email or web address]. Please follow instruction sheet, as given.        Thank you for choosing [facility] !

## 2016-11-01 ENCOUNTER — Other Ambulatory Visit (HOSPITAL_COMMUNITY): Payer: BLUE CROSS/BLUE SHIELD

## 2016-11-01 ENCOUNTER — Encounter (HOSPITAL_COMMUNITY)
Admission: RE | Admit: 2016-11-01 | Discharge: 2016-11-01 | Disposition: A | Discharge status: Home / Self Care | Payer: BLUE CROSS/BLUE SHIELD | Source: Ambulatory Visit | Attending: Cardiology | Admitting: Cardiology

## 2016-11-01 ENCOUNTER — Encounter (HOSPITAL_COMMUNITY): Payer: Self-pay

## 2016-11-01 ENCOUNTER — Ambulatory Visit (HOSPITAL_COMMUNITY)
Admission: RE | Admit: 2016-11-01 | Discharge: 2016-11-01 | Disposition: A | Discharge status: Home / Self Care | Payer: BLUE CROSS/BLUE SHIELD | Source: Ambulatory Visit | Attending: Cardiology | Admitting: Cardiology

## 2016-11-01 ENCOUNTER — Inpatient Hospital Stay (HOSPITAL_COMMUNITY): Admission: RE | Admit: 2016-11-01 | Payer: BLUE CROSS/BLUE SHIELD | Source: Ambulatory Visit

## 2016-11-01 DIAGNOSIS — R072 Precordial pain: Secondary | ICD-10-CM | POA: Insufficient documentation

## 2016-11-01 DIAGNOSIS — R0602 Shortness of breath: Secondary | ICD-10-CM | POA: Insufficient documentation

## 2016-11-01 LAB — NM MYOCAR MULTI W/SPECT W/WALL MOTION / EF
CHL CUP MPHR: 166 {beats}/min
CHL CUP RESTING HR STRESS: 76 {beats}/min
CSEPEDS: 32 s
CSEPHR: 90 %
Estimated workload: 7 METS
Exercise duration (min): 4 min
LV dias vol: 73 mL (ref 46–106)
LV sys vol: 21 mL
Peak HR: 151 {beats}/min
RATE: 0.4
RPE: 17
SDS: 1
SRS: 4
SSS: 5
TID: 1.02

## 2016-11-01 MED ORDER — TECHNETIUM TC 99M TETROFOSMIN IV KIT
10.0000 | PACK | Freq: Once | INTRAVENOUS | Status: AC | PRN
  Administered 2016-11-01: 10 via INTRAVENOUS

## 2016-11-01 MED ORDER — SODIUM CHLORIDE 0.9% FLUSH
INTRAVENOUS | Status: AC
  Administered 2016-11-01: 10 mL via INTRAVENOUS
  Filled 2016-11-01: qty 10

## 2016-11-01 MED ORDER — REGADENOSON 0.4 MG/5ML IV SOLN
INTRAVENOUS | Status: DC
Start: 2016-11-01 — End: 2016-11-01
  Filled 2016-11-01: qty 5

## 2016-11-01 MED ORDER — TECHNETIUM TC 99M TETROFOSMIN IV KIT
30.0000 | PACK | Freq: Once | INTRAVENOUS | Status: AC | PRN
  Administered 2016-11-01: 30 via INTRAVENOUS

## 2016-11-01 NOTE — Progress Notes (Signed)
*  PRELIMINARY RESULTS* Echocardiogram 2D Echocardiogram has been performed.  Jeryl Columbialliott, Shailee Foots 11/01/2016, 9:46 AM

## 2016-11-05 ENCOUNTER — Telehealth: Payer: Self-pay | Admitting: Cardiology

## 2016-11-05 NOTE — Telephone Encounter (Signed)
Pt would like to know results from her tests

## 2016-11-05 NOTE — Telephone Encounter (Signed)
Per  Dr Manley MasonMcDowell,echo showed normal LVEF and valves,copied to pcp,pt aware

## 2016-11-25 ENCOUNTER — Ambulatory Visit (INDEPENDENT_AMBULATORY_CARE_PROVIDER_SITE_OTHER): Payer: BLUE CROSS/BLUE SHIELD | Admitting: Otolaryngology

## 2016-11-25 ENCOUNTER — Other Ambulatory Visit (INDEPENDENT_AMBULATORY_CARE_PROVIDER_SITE_OTHER): Payer: Self-pay | Admitting: Otolaryngology

## 2016-11-25 DIAGNOSIS — J310 Chronic rhinitis: Secondary | ICD-10-CM

## 2016-11-25 DIAGNOSIS — J343 Hypertrophy of nasal turbinates: Secondary | ICD-10-CM

## 2016-11-25 DIAGNOSIS — J342 Deviated nasal septum: Secondary | ICD-10-CM | POA: Diagnosis not present

## 2016-11-25 DIAGNOSIS — J0101 Acute recurrent maxillary sinusitis: Secondary | ICD-10-CM

## 2016-12-02 ENCOUNTER — Ambulatory Visit (HOSPITAL_COMMUNITY)
Admission: RE | Admit: 2016-12-02 | Discharge: 2016-12-02 | Disposition: A | Discharge status: Home / Self Care | Payer: BLUE CROSS/BLUE SHIELD | Source: Ambulatory Visit | Attending: Otolaryngology | Admitting: Otolaryngology

## 2016-12-02 DIAGNOSIS — J0101 Acute recurrent maxillary sinusitis: Secondary | ICD-10-CM | POA: Diagnosis present

## 2016-12-16 IMAGING — US US BREAST LTD UNI RIGHT INC AXILLA
1 series · 9 of 9 positions shown · non-contrast
Comparison: Previous examinations, including the screening
mammogram dated 08/14/2015.

CLINICAL DATA: Possible small asymmetry in the lateral right breast
and possible small asymmetry in upper inner right breast on a recent
screening mammogram.

EXAM:
DIGITAL DIAGNOSTIC RIGHT MAMMOGRAM WITH 3D TOMOSYNTHESIS WITH CAD
ULTRASOUND RIGHT BREAST

[Series 1: us breast ltd uni right inc axilla · 0.09mm/px · 9 of 9 slices shown]
[im 1/9]
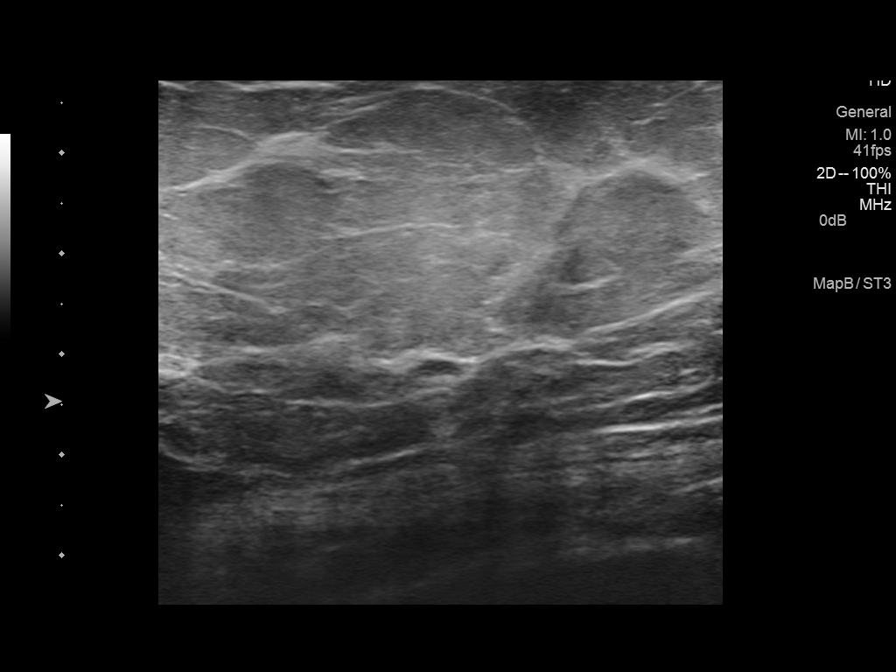
[im 2/9]
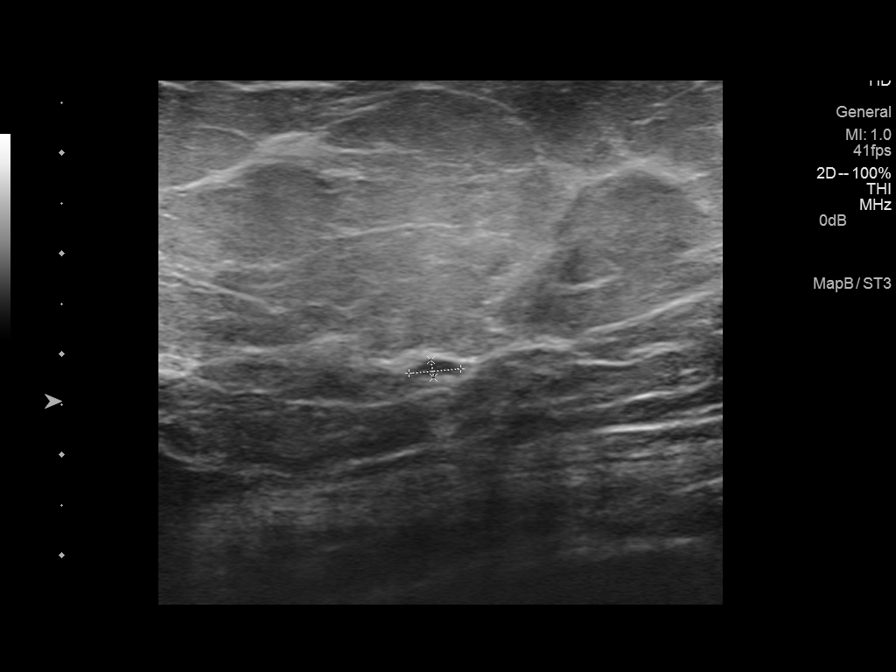
[im 3/9]
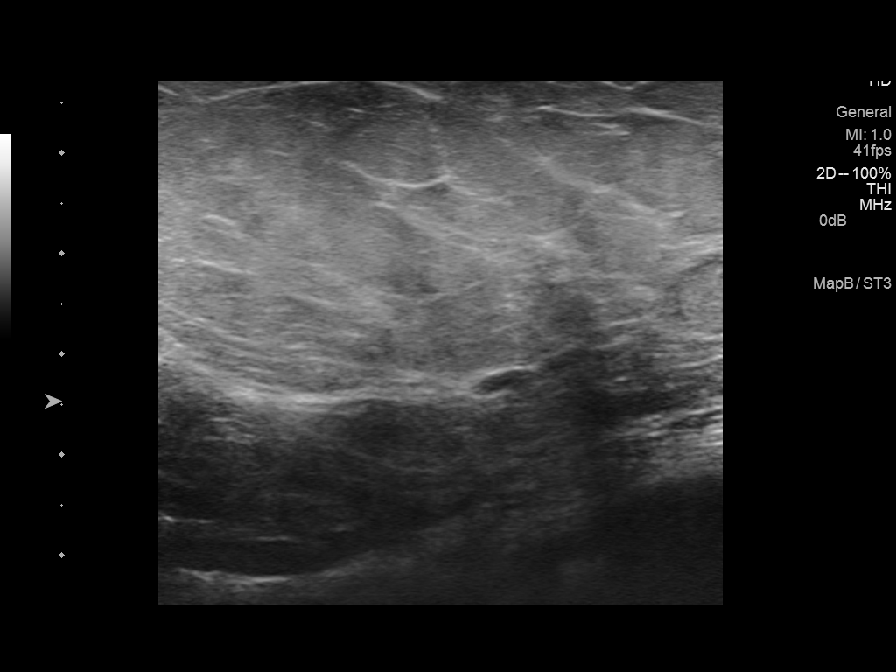
[im 4/9]
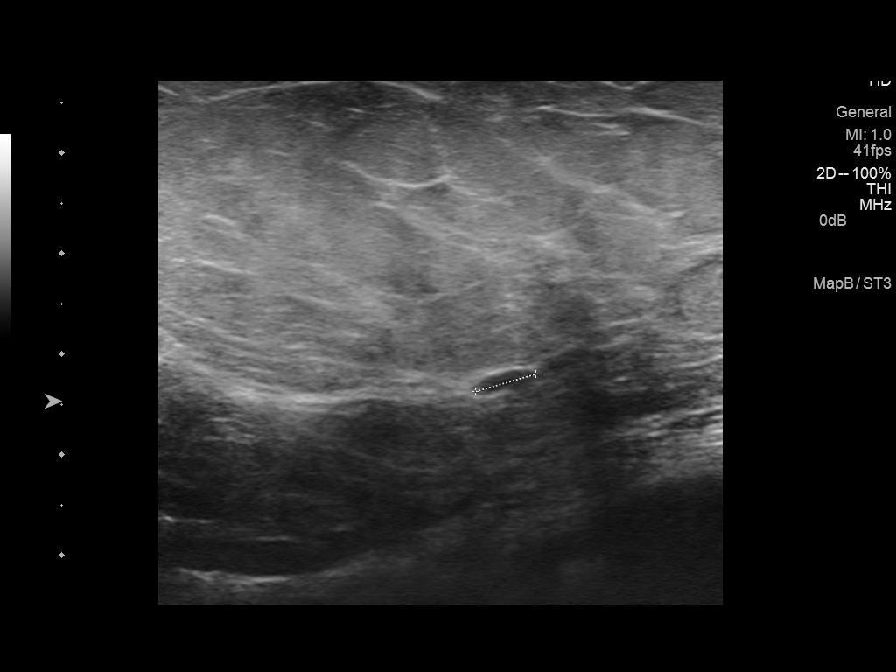
[im 5/9]
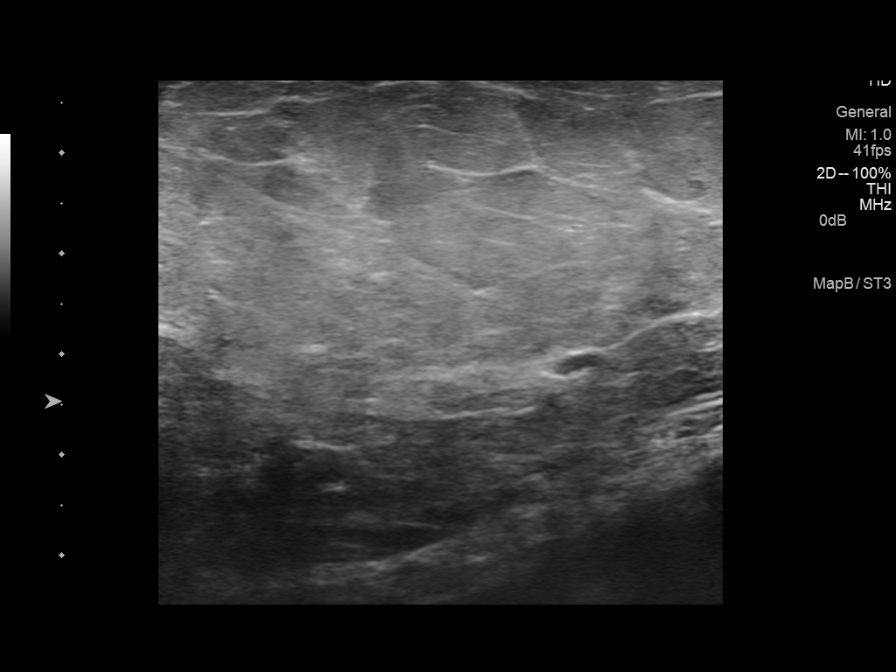
[im 6/9]
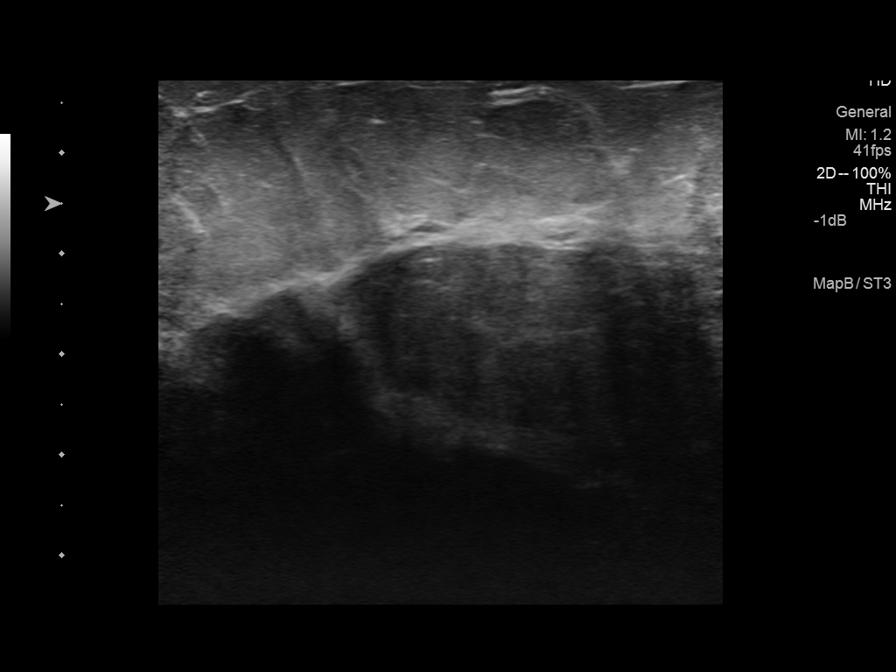
[im 7/9]
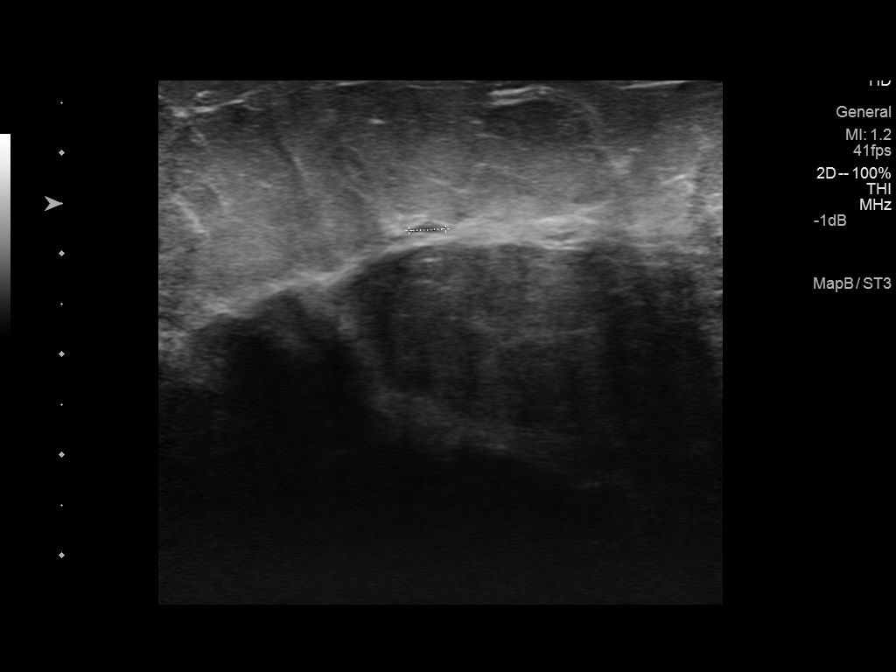
[im 8/9]
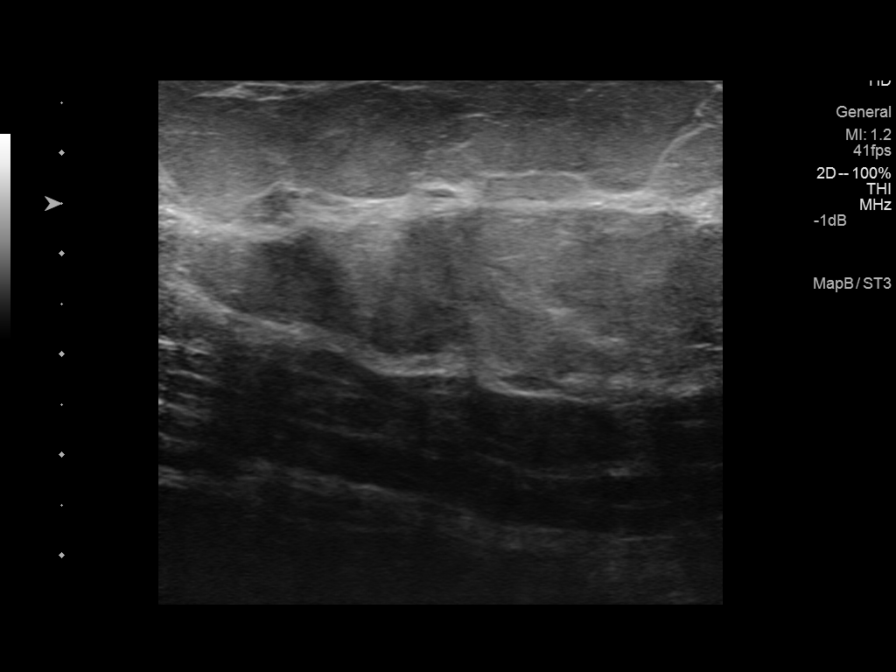
[im 9/9]
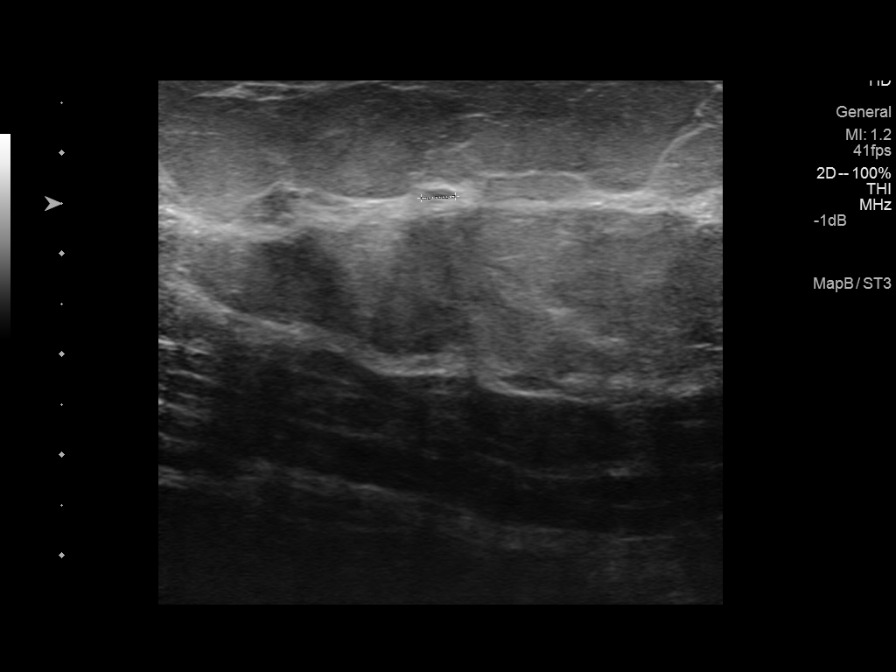

[9 of 9 positions shown; findings below may reference images not displayed]

ACR Breast Density Category b: There are scattered areas of
fibroglandular density.
FINDINGS: 3D tomographic images of the right breast confirm a small, rounded,
circumscribed mass in the upper inner quadrant, centrally. There is
also a smaller, oval, circumscribed mass in the lateral right
breast.

Mammographic images were processed with CAD.

On physical exam, no mass is palpable in the right breast.

Targeted ultrasound is performed, showing a 5 mm cyst in the 1
o'clock position of the right breast, 2 cm from the nipple. There is
also a 4 mm cyst in the 10 o'clock position, 5 cm from the nipple.
IMPRESSION: Small right breast cysts.  No evidence of malignancy.

RECOMMENDATION:
Bilateral screening mammogram in 1 year.

I have discussed the findings and recommendations with the patient.
Results were also provided in writing at the conclusion of the
visit. If applicable, a reminder letter will be sent to the patient
regarding the next appointment.

BI-RADS CATEGORY  2: Benign.

## 2016-12-26 ENCOUNTER — Ambulatory Visit (INDEPENDENT_AMBULATORY_CARE_PROVIDER_SITE_OTHER): Payer: BLUE CROSS/BLUE SHIELD | Admitting: Otolaryngology

## 2016-12-26 DIAGNOSIS — J343 Hypertrophy of nasal turbinates: Secondary | ICD-10-CM | POA: Diagnosis not present

## 2016-12-26 DIAGNOSIS — J310 Chronic rhinitis: Secondary | ICD-10-CM | POA: Diagnosis not present

## 2016-12-26 DIAGNOSIS — J342 Deviated nasal septum: Secondary | ICD-10-CM

## 2017-06-13 ENCOUNTER — Emergency Department (HOSPITAL_COMMUNITY)
Admission: EM | Admit: 2017-06-13 | Discharge: 2017-06-14 | Disposition: A | Discharge status: Home / Self Care | Payer: BLUE CROSS/BLUE SHIELD | Attending: Emergency Medicine | Admitting: Emergency Medicine

## 2017-06-13 ENCOUNTER — Encounter (HOSPITAL_COMMUNITY): Payer: Self-pay | Admitting: Emergency Medicine

## 2017-06-13 ENCOUNTER — Emergency Department (HOSPITAL_COMMUNITY): Payer: BLUE CROSS/BLUE SHIELD

## 2017-06-13 DIAGNOSIS — Z79899 Other long term (current) drug therapy: Secondary | ICD-10-CM | POA: Diagnosis not present

## 2017-06-13 DIAGNOSIS — Z87891 Personal history of nicotine dependence: Secondary | ICD-10-CM | POA: Diagnosis not present

## 2017-06-13 DIAGNOSIS — Z7982 Long term (current) use of aspirin: Secondary | ICD-10-CM | POA: Diagnosis not present

## 2017-06-13 DIAGNOSIS — R52 Pain, unspecified: Secondary | ICD-10-CM

## 2017-06-13 DIAGNOSIS — R300 Dysuria: Secondary | ICD-10-CM | POA: Insufficient documentation

## 2017-06-13 LAB — COMPREHENSIVE METABOLIC PANEL
ALK PHOS: 112 U/L (ref 38–126)
ALT: 23 U/L (ref 14–54)
ANION GAP: 11 (ref 5–15)
AST: 18 U/L (ref 15–41)
Albumin: 4 g/dL (ref 3.5–5.0)
BILIRUBIN TOTAL: 0.5 mg/dL (ref 0.3–1.2)
BUN: 11 mg/dL (ref 6–20)
CALCIUM: 9.4 mg/dL (ref 8.9–10.3)
CO2: 24 mmol/L (ref 22–32)
CREATININE: 0.71 mg/dL (ref 0.44–1.00)
Chloride: 106 mmol/L (ref 101–111)
GFR calc Af Amer: >60 mL/min (ref >60)
GFR calc non Af Amer: >60 mL/min (ref >60)
GLUCOSE: 102 mg/dL — AB (ref 65–99)
Potassium: 3.8 mmol/L (ref 3.5–5.1)
Sodium: 141 mmol/L (ref 135–145)
TOTAL PROTEIN: 7.1 g/dL (ref 6.5–8.1)

## 2017-06-13 LAB — CBC WITH DIFFERENTIAL/PLATELET
BASOS PCT: 0 %
Basophils Absolute: 0 10*3/uL (ref 0.0–0.1)
Eosinophils Absolute: 0.2 10*3/uL (ref 0.0–0.7)
Eosinophils Relative: 3 %
HEMATOCRIT: 41.3 % (ref 36.0–46.0)
HEMOGLOBIN: 13.6 g/dL (ref 12.0–15.0)
Lymphocytes Relative: 7 %
Lymphs Abs: 0.6 10*3/uL — ABNORMAL LOW (ref 0.7–4.0)
MCH: 29.6 pg (ref 26.0–34.0)
MCHC: 32.9 g/dL (ref 30.0–36.0)
MCV: 90 fL (ref 78.0–100.0)
MONOS PCT: 4 %
Monocytes Absolute: 0.4 10*3/uL (ref 0.1–1.0)
NEUTROS ABS: 8 10*3/uL — AB (ref 1.7–7.7)
NEUTROS PCT: 86 %
Platelets: 256 10*3/uL (ref 150–400)
RBC: 4.59 MIL/uL (ref 3.87–5.11)
RDW: 13.5 % (ref 11.5–15.5)
WBC: 9.2 10*3/uL (ref 4.0–10.5)

## 2017-06-13 LAB — URINALYSIS, ROUTINE W REFLEX MICROSCOPIC
Bacteria, UA: NONE SEEN
Bilirubin Urine: NEGATIVE
GLUCOSE, UA: NEGATIVE mg/dL
Ketones, ur: NEGATIVE mg/dL
Leukocytes, UA: NEGATIVE
Nitrite: POSITIVE — AB
PH: 5 (ref 5.0–8.0)
Protein, ur: NEGATIVE mg/dL
SPECIFIC GRAVITY, URINE: 1.011 (ref 1.005–1.030)

## 2017-06-13 MED ORDER — ONDANSETRON HCL 4 MG/2ML IJ SOLN
4.0000 mg | Freq: Once | INTRAMUSCULAR | Status: AC
  Administered 2017-06-13: 4 mg via INTRAVENOUS
  Filled 2017-06-13: qty 2

## 2017-06-13 MED ORDER — HYDROCODONE-ACETAMINOPHEN 5-325 MG PO TABS: 1.0000 | ORAL_TABLET | Freq: Four times a day (QID) | ORAL | 0 refills | Status: DC | PRN

## 2017-06-13 MED ORDER — HYDROMORPHONE HCL 1 MG/ML IJ SOLN
0.5000 mg | Freq: Once | INTRAMUSCULAR | Status: AC
  Administered 2017-06-13: 0.5 mg via INTRAVENOUS
  Filled 2017-06-13: qty 1

## 2017-06-13 MED ORDER — CEPHALEXIN 500 MG PO CAPS
500.0000 mg | ORAL_CAPSULE | Freq: Four times a day (QID) | ORAL | 0 refills | Status: DC
Start: 2017-06-13 — End: 2017-08-19

## 2017-06-13 MED ORDER — SODIUM CHLORIDE 0.9 % IV BOLUS (SEPSIS)
1000.0000 mL | Freq: Once | INTRAVENOUS | Status: AC
  Administered 2017-06-13: 1000 mL via INTRAVENOUS

## 2017-06-13 MED ORDER — DEXTROSE 5 % IV SOLN
1.0000 g | Freq: Once | INTRAVENOUS | Status: AC
  Administered 2017-06-14: 1 g via INTRAVENOUS
  Filled 2017-06-13: qty 10

## 2017-06-13 NOTE — ED Triage Notes (Signed)
Pt reports saw Dr Sherwood GamblerFusco on Weds dx'd with UTI, now with fever and continued malaise   Last Tylenol 650 mg at 1600

## 2017-06-13 NOTE — ED Provider Notes (Signed)
- [facility] DEPT Provider Note   CSN: [id number] Arrival date & time: 06/13/17  1926     History   Chief Complaint Chief Complaint  Patient presents with  . Dysuria    HPI Tammy Webster is a 56 y.o. female.  Patient is being treated for UTI with Macrodantin. She has developed right flank pain and dysuria and fever   The history is provided by the patient.  Dysuria   This is a recurrent problem. The problem occurs every urination. The problem has not changed since onset.The quality of the pain is described as burning. The pain is at a severity of 4/10. The pain is moderate. The maximum temperature recorded prior to her arrival was 100 to 100.9 F. She is not sexually active. Pertinent negatives include no chills, no frequency and no hematuria.    Past Medical History:  Diagnosis Date  . Anxiety   . Arthritis   . Chronic back pain   . Chronic constipation   . Gastric ulcer   . Headache(784.0)   . Helicobacter pylori gastritis    Treated 2013  . History of anemia   . History of bronchitis   . History of pneumonia   . Hyperlipidemia   . Insomnia   . MRSA infection    2007-2009    Patient Active Problem List   Diagnosis Date Noted  . Spinal stenosis, lumbar region, with neurogenic claudication 02/28/2014  . Dyslipidemia 02/11/2014  . Cardiomegaly 01/26/2014  . DOE (dyspnea on exertion) 01/26/2014  . Preoperative cardiovascular examination 01/26/2014  . Right knee pain 11/03/2012  . Low back pain 11/03/2012  . Helicobacter pylori gastritis 06/29/2012  . Hemorrhoids 06/29/2012  . Epigastric pain 04/02/2012  . GERD (gastroesophageal reflux disease) 04/02/2012  . Constipation, chronic 04/02/2012  . Esophageal dysphagia 04/02/2012  . Rectal bleeding 04/02/2012    Past Surgical History:  Procedure Laterality Date  . CARPAL TUNNEL RELEASE Bilateral   . COLONOSCOPY  04/13/12   Rourk-anal canal hemorrhoids/normal colon,rectum  . ESOPHAGOGASTRODUODENOSCOPY   04/13/12   Rourk-normal esophaus s/p dilation/h pylori gastritis  . GALLBLADDER SURGERY  2004  . MALONEY DILATION  04/13/2012   53F  . SAVORY DILATION  04/13/2012  . TUBAL LIGATION      OB History    No data available       Home Medications    Prior to Admission medications   Medication Sig Start Date End Date Taking? Authorizing Provider  Aspirin-Salicylamide-Caffeine (BC HEADACHE POWDER PO) Take 1 packet by mouth daily as needed (for pain).    Yes [provider]  LORazepam (ATIVAN) 1 MG tablet Take 1 mg by mouth every 8 (eight) hours as needed for anxiety.    Yes [provider]  nitrofurantoin, macrocrystal-monohydrate, (MACROBID) 100 MG capsule take 1 capsule by mouth twice a day for 7 days 06/12/17  Yes [provider]  phenazopyridine (PYRIDIUM) 200 MG tablet take 1 tablet by mouth three times a day for 3 days 06/12/17  Yes [provider]  cephALEXin (KEFLEX) 500 MG capsule Take 1 capsule (500 mg total) by mouth 4 (four) times daily. 06/13/17   Bethann Berkshire, MD  HYDROcodone-acetaminophen (NORCO/VICODIN) 5-325 MG tablet Take 1 tablet by mouth every 6 (six) hours as needed for moderate pain. 06/13/17   Bethann Berkshire, MD  HYDROcodone-acetaminophen (NORCO/VICODIN) 5-325 MG tablet Take 1 tablet by mouth every 6 (six) hours as needed for moderate pain. 06/13/17   Bethann Berkshire, MD    Family History  Family History  Problem Relation Age of Onset  . Colon polyps Father        heart disease - stenting, "shocked heart twice"  . Gastric cancer Father        deceased 29-May-2012  . Heart disease Maternal Grandmother 45  . Breast cancer Maternal Grandmother 73  . Heart disease Maternal Grandfather 60       also MI  . Heart disease Paternal Grandmother 18  . Diabetes Paternal Grandmother   . Heart disease Paternal Grandfather 33  . Heart failure Paternal Grandfather        enlarged heart  . Liver disease Neg Hx   . Colon cancer Neg Hx     Social  History Social History  Substance Use Topics  . Smoking status: Former Smoker    Packs/day: 2.00    Years: 30.00    Types: Cigarettes  . Smokeless tobacco: Never Used  . Alcohol use No     Allergies   Patient has no known allergies.   Review of Systems Review of Systems  Constitutional: Negative for appetite change, chills and fatigue.  HENT: Negative for congestion, ear discharge and sinus pressure.   Eyes: Negative for discharge.  Respiratory: Negative for cough.   Cardiovascular: Negative for chest pain.  Gastrointestinal: Negative for abdominal pain and diarrhea.  Genitourinary: Positive for dysuria. Negative for frequency and hematuria.  Musculoskeletal: Negative for back pain.  Skin: Negative for rash.  Neurological: Negative for seizures and headaches.  Psychiatric/Behavioral: Negative for hallucinations.     Physical Exam Updated Vital Signs BP 126/75   Pulse 93   Temp 100.2 F (37.9 C)   Resp 20   Ht 5\' 6"  (1.676 m)   Wt 108.4 kg (239 lb)   SpO2 94%   BMI 38.58 kg/m   Physical Exam  Constitutional: She is oriented to person, place, and time. She appears well-developed.  HENT:  Head: Normocephalic.  Eyes: Conjunctivae and EOM are normal. No scleral icterus.  Neck: Neck supple. No thyromegaly present.  Cardiovascular: Normal rate and regular rhythm.  Exam reveals no gallop and no friction rub.   No murmur heard. Pulmonary/Chest: No stridor. She has no wheezes. She has no rales. She exhibits no tenderness.  Abdominal: She exhibits no distension. There is no tenderness. There is no rebound.  Genitourinary:  Genitourinary Comments: Tender right flank.  Musculoskeletal: Normal range of motion. She exhibits no edema.  Lymphadenopathy:    She has no cervical adenopathy.  Neurological: She is oriented to person, place, and time. She exhibits normal muscle tone. Coordination normal.  Skin: No rash noted. No erythema.  Psychiatric: She has a normal mood and  affect. Her behavior is normal.     ED Treatments / Results  Labs (all labs ordered are listed, but only abnormal results are displayed) Labs Reviewed  CBC WITH DIFFERENTIAL/PLATELET - Abnormal; Notable for the following:       Result Value   Neutro Abs 8.0 (*)    Lymphs Abs 0.6 (*)    All other components within normal limits  COMPREHENSIVE METABOLIC PANEL - Abnormal; Notable for the following:    Glucose, Bld 102 (*)    All other components within normal limits  URINALYSIS, ROUTINE W REFLEX MICROSCOPIC - Abnormal; Notable for the following:    Color, Urine AMBER (*)    Hgb urine dipstick SMALL (*)    Nitrite POSITIVE (*)    Squamous Epithelial / LPF 0-5 (*)    All  other components within normal limits  URINE CULTURE    EKG  EKG Interpretation None       Radiology Ct Renal Stone Study  Result Date: 06/13/2017 CLINICAL DATA:  Acute onset of dysuria, lower back pain and fever. Initial encounter. EXAM: CT ABDOMEN AND PELVIS WITHOUT CONTRAST TECHNIQUE: Multidetector CT imaging of the abdomen and pelvis was performed following the standard protocol without IV contrast. COMPARISON:  CT of the abdomen and pelvis from 02/03/2009, and MRI of the lumbar spine performed 03/04/2013 FINDINGS: Lower chest: The visualized lung bases are grossly clear. The visualized portions of the mediastinum are unremarkable. Hepatobiliary: A 9 mm hypodensity is noted at the right hepatic lobe. The liver is otherwise unremarkable. The patient is status post cholecystectomy, with clips noted at the gallbladder fossa. The common bile duct remains normal in caliber. Pancreas: The pancreas is within normal limits. Spleen: The spleen is unremarkable in appearance. Adrenals/Urinary Tract: The adrenal glands are unremarkable in appearance. Mild nonspecific perinephric stranding is noted bilaterally. There is no evidence of hydronephrosis. No renal or ureteral stones are identified. Stomach/Bowel: The stomach is  unremarkable in appearance. The small bowel is within normal limits. The appendix is normal in caliber, without evidence of appendicitis. The colon is unremarkable in appearance. Vascular/Lymphatic: Scattered calcification is seen along the abdominal aorta and its branches. The abdominal aorta is otherwise grossly unremarkable. The inferior vena cava is grossly unremarkable. No retroperitoneal lymphadenopathy is seen. No pelvic sidewall lymphadenopathy is identified. Reproductive: The bladder is mildly distended and grossly unremarkable. The uterus is unremarkable in appearance. The ovaries are relatively symmetric. No suspicious adnexal masses are seen. Other: No additional soft tissue abnormalities are seen. Musculoskeletal: No acute osseous abnormalities are identified. The patient is status post lumbar spinal fusion at L4-L5, with underlying decompression. The visualized musculature is unremarkable in appearance. IMPRESSION: 1. No acute abnormality seen to explain the patient's symptoms. 2. Scattered aortic atherosclerosis. 3. 9 mm nonspecific hypodensity at the right hepatic lobe. Electronically Signed   By: Roanna Raider M.D.   On: 06/13/2017 22:32    Procedures Procedures (including critical care time)  Medications Ordered in ED Medications  cefTRIAXone (ROCEPHIN) 1 g in dextrose 5 % 50 mL IVPB (not administered)  HYDROmorphone (DILAUDID) injection 0.5 mg (0.5 mg Intravenous Given 06/13/17 2037)  ondansetron (ZOFRAN) injection 4 mg (4 mg Intravenous Given 06/13/17 2036)  sodium chloride 0.9 % bolus 1,000 mL (0 mLs Intravenous Stopped 06/13/17 2140)     Initial Impression / Assessment and Plan / ED Course  I have reviewed the triage vital signs and the nursing notes.  Pertinent labs & imaging results that were available during my care of the patient were reviewed by me and considered in my medical decision making (see chart for details).     Labs unremarkable. CT scan negative. I suspect  patient has a partially treated urinary tract infection with Macrodantin. We will culture her urine give her some Rocephin put on Keflex and pain medicine and have him follow back up with her doctor  Final Clinical Impressions(s) / ED Diagnoses   Final diagnoses:  Pain  Dysuria    New Prescriptions New Prescriptions   CEPHALEXIN (KEFLEX) 500 MG CAPSULE    Take 1 capsule (500 mg total) by mouth 4 (four) times daily.   HYDROCODONE-ACETAMINOPHEN (NORCO/VICODIN) 5-325 MG TABLET    Take 1 tablet by mouth every 6 (six) hours as needed for moderate pain.   HYDROCODONE-ACETAMINOPHEN (NORCO/VICODIN) 5-325 MG TABLET  Take 1 tablet by mouth every 6 (six) hours as needed for moderate pain.     Bethann BerkshireZammit, Zakariye Nee, MD 06/13/17 2354

## 2017-06-13 NOTE — ED Triage Notes (Signed)
Pt c/o dysuria, back pain, and fever since seeing pcp on Wed.

## 2017-06-13 NOTE — Discharge Instructions (Signed)
Follow up with your md next week.  Return if  problems °

## 2017-06-14 NOTE — ED Notes (Signed)
Patient given discharge instruction, verbalized understand. IV removed, band aid applied. Patient ambulatory out of the department.  

## 2017-06-14 NOTE — ED Notes (Signed)
Pt getting antibiotics

## 2017-06-15 LAB — URINE CULTURE

## 2017-08-19 ENCOUNTER — Encounter (HOSPITAL_BASED_OUTPATIENT_CLINIC_OR_DEPARTMENT_OTHER): Payer: Self-pay | Admitting: *Deleted

## 2017-09-03 ENCOUNTER — Other Ambulatory Visit: Payer: Self-pay | Admitting: Otolaryngology

## 2017-09-08 ENCOUNTER — Encounter (HOSPITAL_BASED_OUTPATIENT_CLINIC_OR_DEPARTMENT_OTHER): Payer: Self-pay | Admitting: Anesthesiology

## 2017-09-08 ENCOUNTER — Ambulatory Visit (HOSPITAL_COMMUNITY)
Admission: RE | Admit: 2017-09-08 | Discharge: 2017-09-08 | Disposition: A | Discharge status: Home / Self Care | Payer: BLUE CROSS/BLUE SHIELD | Source: Ambulatory Visit | Attending: Otolaryngology | Admitting: Otolaryngology

## 2017-09-08 ENCOUNTER — Ambulatory Visit (HOSPITAL_BASED_OUTPATIENT_CLINIC_OR_DEPARTMENT_OTHER): Payer: BLUE CROSS/BLUE SHIELD | Admitting: Anesthesiology

## 2017-09-08 ENCOUNTER — Encounter (HOSPITAL_BASED_OUTPATIENT_CLINIC_OR_DEPARTMENT_OTHER)
Admission: RE | Disposition: A | Discharge status: Home / Self Care | Payer: Self-pay | Source: Ambulatory Visit | Attending: Otolaryngology

## 2017-09-08 DIAGNOSIS — R51 Headache: Secondary | ICD-10-CM | POA: Insufficient documentation

## 2017-09-08 DIAGNOSIS — F419 Anxiety disorder, unspecified: Secondary | ICD-10-CM | POA: Insufficient documentation

## 2017-09-08 DIAGNOSIS — J342 Deviated nasal septum: Secondary | ICD-10-CM | POA: Insufficient documentation

## 2017-09-08 DIAGNOSIS — J343 Hypertrophy of nasal turbinates: Secondary | ICD-10-CM | POA: Diagnosis not present

## 2017-09-08 DIAGNOSIS — K219 Gastro-esophageal reflux disease without esophagitis: Secondary | ICD-10-CM | POA: Diagnosis not present

## 2017-09-08 DIAGNOSIS — D649 Anemia, unspecified: Secondary | ICD-10-CM | POA: Diagnosis not present

## 2017-09-08 HISTORY — PX: NASAL SEPTOPLASTY W/ TURBINOPLASTY: SHX2070

## 2017-09-08 HISTORY — PX: ENDOSCOPIC CONCHA BULLOSA RESECTION: SHX6395

## 2017-09-08 SURGERY — SEPTOPLASTY, NOSE, WITH NASAL TURBINATE REDUCTION
Anesthesia: General | Site: Nose | Laterality: Bilateral

## 2017-09-08 MED ORDER — FENTANYL CITRATE (PF) 100 MCG/2ML IJ SOLN
INTRAMUSCULAR | Status: AC
  Filled 2017-09-08: qty 2

## 2017-09-08 MED ORDER — HYDROMORPHONE HCL 1 MG/ML IJ SOLN
0.2500 mg | INTRAMUSCULAR | Status: DC | PRN
  Administered 2017-09-08 (×2): 0.5 mg via INTRAVENOUS

## 2017-09-08 MED ORDER — MIDAZOLAM HCL 2 MG/2ML IJ SOLN
1.0000 mg | INTRAMUSCULAR | Status: DC | PRN
  Administered 2017-09-08: 2 mg via INTRAVENOUS

## 2017-09-08 MED ORDER — AMOXICILLIN 875 MG PO TABS: 875.0000 mg | ORAL_TABLET | Freq: Two times a day (BID) | ORAL | 0 refills | Status: AC

## 2017-09-08 MED ORDER — SCOPOLAMINE 1 MG/3DAYS TD PT72: 1.0000 | MEDICATED_PATCH | Freq: Once | TRANSDERMAL | Status: DC | PRN

## 2017-09-08 MED ORDER — HYDROMORPHONE HCL 1 MG/ML IJ SOLN
INTRAMUSCULAR | Status: AC
  Filled 2017-09-08: qty 0.5

## 2017-09-08 MED ORDER — FENTANYL CITRATE (PF) 100 MCG/2ML IJ SOLN
50.0000 ug | INTRAMUSCULAR | Status: AC | PRN
  Administered 2017-09-08 (×2): 50 ug via INTRAVENOUS
  Administered 2017-09-08: 100 ug via INTRAVENOUS

## 2017-09-08 MED ORDER — PROPOFOL 10 MG/ML IV BOLUS
INTRAVENOUS | Status: AC
  Filled 2017-09-08: qty 20

## 2017-09-08 MED ORDER — MUPIROCIN 2 % EX OINT
TOPICAL_OINTMENT | CUTANEOUS | Status: DC | PRN
  Administered 2017-09-08: 1 via TOPICAL

## 2017-09-08 MED ORDER — OXYCODONE HCL 5 MG/5ML PO SOLN: 5.0000 mg | Freq: Once | ORAL | Status: AC | PRN

## 2017-09-08 MED ORDER — PROMETHAZINE HCL 25 MG/ML IJ SOLN: 6.2500 mg | INTRAMUSCULAR | Status: DC | PRN

## 2017-09-08 MED ORDER — OXYCODONE HCL 5 MG PO TABS
ORAL_TABLET | ORAL | Status: AC
  Filled 2017-09-08: qty 1

## 2017-09-08 MED ORDER — PROPOFOL 10 MG/ML IV BOLUS
INTRAVENOUS | Status: DC | PRN
  Administered 2017-09-08: 200 mg via INTRAVENOUS

## 2017-09-08 MED ORDER — ROCURONIUM BROMIDE 100 MG/10ML IV SOLN
INTRAVENOUS | Status: DC | PRN
  Administered 2017-09-08: 50 mg via INTRAVENOUS

## 2017-09-08 MED ORDER — SUGAMMADEX SODIUM 500 MG/5ML IV SOLN
INTRAVENOUS | Status: DC | PRN
  Administered 2017-09-08: 225 mg via INTRAVENOUS

## 2017-09-08 MED ORDER — OXYCODONE HCL 5 MG PO TABS
5.0000 mg | ORAL_TABLET | Freq: Once | ORAL | Status: AC | PRN
  Administered 2017-09-08: 5 mg via ORAL

## 2017-09-08 MED ORDER — DEXAMETHASONE SODIUM PHOSPHATE 4 MG/ML IJ SOLN
INTRAMUSCULAR | Status: DC | PRN
  Administered 2017-09-08: 10 mg via INTRAVENOUS

## 2017-09-08 MED ORDER — LIDOCAINE-EPINEPHRINE 1 %-1:100000 IJ SOLN
INTRAMUSCULAR | Status: DC | PRN
  Administered 2017-09-08: 5 mL

## 2017-09-08 MED ORDER — MIDAZOLAM HCL 2 MG/2ML IJ SOLN
INTRAMUSCULAR | Status: AC
  Filled 2017-09-08: qty 2

## 2017-09-08 MED ORDER — ONDANSETRON HCL 4 MG/2ML IJ SOLN
INTRAMUSCULAR | Status: DC | PRN
  Administered 2017-09-08: 4 mg via INTRAVENOUS

## 2017-09-08 MED ORDER — OXYMETAZOLINE HCL 0.05 % NA SOLN
NASAL | Status: DC | PRN
  Administered 2017-09-08: 1 via TOPICAL

## 2017-09-08 MED ORDER — LACTATED RINGERS IV SOLN
INTRAVENOUS | Status: DC
  Administered 2017-09-08 (×2): via INTRAVENOUS
  Administered 2017-09-08: 10 mL/h via INTRAVENOUS

## 2017-09-08 MED ORDER — MEPERIDINE HCL 25 MG/ML IJ SOLN: 6.2500 mg | INTRAMUSCULAR | Status: DC | PRN

## 2017-09-08 MED ORDER — OXYCODONE-ACETAMINOPHEN 5-325 MG PO TABS: 1.0000 | ORAL_TABLET | ORAL | 0 refills | Status: DC | PRN

## 2017-09-08 SURGICAL SUPPLY — 31 items
ATTRACTOMAT 16X20 MAGNETIC DRP (DRAPES) IMPLANT
CANISTER SUCT 1200ML W/VALVE (MISCELLANEOUS) ×3 IMPLANT
COAGULATOR SUCT 8FR VV (MISCELLANEOUS) ×3 IMPLANT
DECANTER SPIKE VIAL GLASS SM (MISCELLANEOUS) IMPLANT
DRSG NASOPORE 8CM (GAUZE/BANDAGES/DRESSINGS) ×3 IMPLANT
DRSG TELFA 3X8 NADH (GAUZE/BANDAGES/DRESSINGS) IMPLANT
ELECT REM PT RETURN 9FT ADLT (ELECTROSURGICAL) ×3
ELECTRODE REM PT RTRN 9FT ADLT (ELECTROSURGICAL) ×1 IMPLANT
GLOVE BIO SURGEON STRL SZ7.5 (GLOVE) ×3 IMPLANT
GLOVE SURG SS PI 7.0 STRL IVOR (GLOVE) ×3 IMPLANT
GOWN STRL REUS W/ TWL LRG LVL3 (GOWN DISPOSABLE) ×2 IMPLANT
GOWN STRL REUS W/TWL LRG LVL3 (GOWN DISPOSABLE) ×6
NEEDLE HYPO 25X1 1.5 SAFETY (NEEDLE) ×3 IMPLANT
NS IRRIG 1000ML POUR BTL (IV SOLUTION) ×3 IMPLANT
PACK BASIN DAY SURGERY FS (CUSTOM PROCEDURE TRAY) ×3 IMPLANT
PACK ENT DAY SURGERY (CUSTOM PROCEDURE TRAY) ×3 IMPLANT
SLEEVE SCD COMPRESS KNEE MED (MISCELLANEOUS) ×3 IMPLANT
SOLUTION BUTLER CLEAR DIP (MISCELLANEOUS) ×3 IMPLANT
SPLINT NASAL AIRWAY SILICONE (MISCELLANEOUS) ×3 IMPLANT
SPONGE GAUZE 2X2 8PLY STER LF (GAUZE/BANDAGES/DRESSINGS) ×1
SPONGE GAUZE 2X2 8PLY STRL LF (GAUZE/BANDAGES/DRESSINGS) ×2 IMPLANT
SPONGE NEURO XRAY DETECT 1X3 (DISPOSABLE) ×3 IMPLANT
SUT CHROMIC 4 0 P 3 18 (SUTURE) ×3 IMPLANT
SUT PLAIN 4 0 ~~LOC~~ 1 (SUTURE) ×3 IMPLANT
SUT PROLENE 3 0 PS 2 (SUTURE) ×3 IMPLANT
SUT VIC AB 4-0 P-3 18XBRD (SUTURE) IMPLANT
SUT VIC AB 4-0 P3 18 (SUTURE)
TOWEL OR 17X24 6PK STRL BLUE (TOWEL DISPOSABLE) ×3 IMPLANT
TUBE SALEM SUMP 12R W/ARV (TUBING) IMPLANT
TUBE SALEM SUMP 16 FR W/ARV (TUBING) ×3 IMPLANT
YANKAUER SUCT BULB TIP NO VENT (SUCTIONS) ×3 IMPLANT

## 2017-09-08 NOTE — Anesthesia Postprocedure Evaluation (Signed)
Anesthesia Post Note  Patient: Tammy Webster  Procedure(s) Performed: Procedure(s) (LRB): NASAL SEPTOPLASTY WITH TURBINATE REDUCTION (Bilateral) ENDOSCOPIC CONCHA BULLOSA RESECTION (Bilateral)     Patient location during evaluation: PACU Anesthesia Type: General Level of consciousness: sedated Pain management: pain level controlled Vital Signs Assessment: post-procedure vital signs reviewed and stable Respiratory status: spontaneous breathing and respiratory function stable Cardiovascular status: stable Postop Assessment: no apparent nausea or vomiting Anesthetic complications: no    Last Vitals:  Vitals:   09/08/17 1149 09/08/17 1206  BP: (!) 160/92 (!) 160/92  Pulse:  73  Resp:  15  Temp:  36.7 C  SpO2:  97%    Last Pain:  Vitals:   09/08/17 1206  TempSrc: Oral  PainSc: 4                  Cary Lothrop DANIEL

## 2017-09-08 NOTE — Anesthesia Preprocedure Evaluation (Signed)
Anesthesia Evaluation  Patient identified by MRN, date of birth, ID band Patient awake    Reviewed: Allergy & Precautions, H&P , NPO status , Patient's Chart, lab work & pertinent test results  Airway Mallampati: II  TM Distance: >3 FB Neck ROM: Full    Dental  (+) Dental Advisory Given, Partial Upper, Partial Lower   Pulmonary shortness of breath, pneumonia, former smoker,    breath sounds clear to auscultation       Cardiovascular  Rhythm:Regular Rate:Normal     Neuro/Psych  Headaches, Anxiety    GI/Hepatic Neg liver ROS, PUD, GERD  ,  Endo/Other  negative endocrine ROS  Renal/GU negative Renal ROS     Musculoskeletal   Abdominal   Peds  Hematology  (+) anemia ,   Anesthesia Other Findings   Reproductive/Obstetrics                             Anesthesia Physical  Anesthesia Plan  ASA: II  Anesthesia Plan: General   Post-op Pain Management:    Induction: Intravenous  PONV Risk Score and Plan: 2 and Ondansetron, Midazolam and Treatment may vary due to age or medical condition  Airway Management Planned: Oral ETT  Additional Equipment:   Intra-op Plan:   Post-operative Plan:   Informed Consent: I have reviewed the patients History and Physical, chart, labs and discussed the procedure including the risks, benefits and alternatives for the proposed anesthesia with the patient or authorized representative who has indicated his/her understanding and acceptance.   Dental advisory given  Plan Discussed with: CRNA, Anesthesiologist and Surgeon  Anesthesia Plan Comments:         Anesthesia Quick Evaluation

## 2017-09-08 NOTE — Discharge Instructions (Addendum)

## 2017-09-08 NOTE — H&P (Signed)
Cc: Chronic nasal obstruction  HPI: The patient is a 56 year old female who returns today for her follow-up evaluation.  The patient was last seen 1 month ago. At that time, she was noted to have chronic rhinitis, nasal mucosal congestion, left septal deviation, and bilateral inferior turbinate hypertrophy.  The patient was placed on Flonase nasal spray and Atrovent nasal spray.  She also underwent a paranasal sinus CT scan. The CT showed no evidence of acute or chronic sinusitis.  No opacification of her sinus cavity was noted. However, the CT shows significant nasal septal deviation, bilateral concha bullosa, and bilateral inferior turbinate hypertrophy. The patient returns today complaining of persistent nasal congestion.  She also complains of sore throat, especially in the morning. The patient has been mouth breathing at night.  She denies any fever, facial pain, or visual change. No other ENT, GI, or respiratory issue noted since the last visit.   Exam: General: Communicates without difficulty, well nourished, no acute distress. Head: Normocephalic, no evidence injury, no tenderness, facial buttresses intact without stepoff. Eyes: PERRL, EOMI. No scleral icterus, conjunctivae clear. Neuro: CN II exam reveals vision grossly intact.  No nystagmus at any point of gaze. Ears: Auricles well formed without lesions.  Ear canals are intact without mass or lesion.  No erythema or edema is appreciated.  The TMs are intact without fluid. Nose: External evaluation reveals normal support and skin without lesions.  Dorsum is intact.  Anterior rhinoscopy reveals congested and edematous mucosa over anterior aspect of the hypertrophied turbinates and deviated nasal septum.  No purulence is noted. Middle meatus is not well visualized. Nasal septal deviation noted. Oral:  Oral cavity and oropharynx are intact, symmetric, without erythema or edema.  Mucosa is moist without lesions. Neck: Full range of motion without pain.   There is no significant lymphadenopathy.  No masses palpable.  Thyroid bed within normal limits to palpation.  Parotid glands and submandibular glands equal bilaterally without mass.  Trachea is midline. Neuro:  CN 2-12 grossly intact. Gait normal. Vestibular: No nystagmus at any point of gaze.   Assessment 1.  Chronic rhinitis with nasal mucosal congestion, nasal septal deviation, bilateral concha bullosa, and bilateral inferior turbinate hypertrophy.  2.  There is no evidence of acute or chronic sinusitis on her sinus CT scan.   Plan  1.  The physical exam findings and the CT images are extensively reviewed with the patient.  2.  The patient is reassured that no significant acute or chronic sinusitis is noted.  3.  The patient should continue with her Flonase/Atrovent nasal sprays.  4.  Due to her persistent nasal obstruction, the patient may benefit from undergoing septoplasty, endoscopic concha bullosa resection, and bilateral turbinate reduction. The R/B/A of the procedures are reviewed. The patient would like to proceed with the procedures.

## 2017-09-08 NOTE — Op Note (Signed)
DATE OF PROCEDURE: 09/08/2017  OPERATIVE REPORT   SURGEON: Newman Pies, MD   PREOPERATIVE DIAGNOSES:  1. Severe nasal septal deviation.  2. Bilateral inferior turbinate hypertrophy.  3. Chronic nasal obstruction. 4. Bilateral concha bullosa.  POSTOPERATIVE DIAGNOSES:  1. Severe nasal septal deviation.  2. Bilateral inferior turbinate hypertrophy.  3. Chronic nasal obstruction. 4. Bilateral concha bullosa.  PROCEDURE PERFORMED:  1. Septoplasty.  2. Bilateral partial inferior turbinate resection.  3. Bilateral endoscopic concha bullosa resection.  ANESTHESIA: General endotracheal tube anesthesia.   COMPLICATIONS: None.   ESTIMATED BLOOD LOSS: 450 mL.   INDICATION FOR PROCEDURE: Tammy Webster is a 56 y.o. female with a history of chronic nasal obstruction. The patient was  treated with antihistamine, decongestant, and steroid nasal spray. However, the patient continued to be symptomatic. On examination, the patient was noted to have bilateral severe inferior turbinate hypertrophy, bilateral concha bullosa, and significant nasal septal deviation, causing significant nasal obstruction. Based on the above findings, the decision was made for the patient to undergo the above-stated procedures. The risks, benefits, alternatives, and details of the procedure were discussed with the patient. Questions were invited and answered. Informed consent was obtained.   DESCRIPTION OF PROCEDURE: The patient was taken to the operating room and placed supine on the operating table. General endotracheal tube anesthesia was administered by the anesthesiologist. The patient was positioned, and prepped and draped in the standard fashion for nasal surgery. Pledgets soaked with Afrin were placed in both nasal cavities for decongestion. The pledgets were subsequently removed. The above mentioned severe septal deviation was again noted. 1% lidocaine with 1:100,000 epinephrine was injected onto the nasal septum  bilaterally. A hemitransfixion incision was made on the left side. The mucosal flap was carefully elevated on the left side. A cartilaginous incision was made 1 cm superior to the caudal margin of the nasal septum. Mucosal flap was also elevated on the right side in the similar fashion. It should be noted that due to the severe septal deviation, the deviated portion of the cartilaginous and bony septum had to be removed in piecemeal fashion. Once the deviated portions were removed, a straight midline septum was achieved. The septum was then quilted with 4-0 plain gut sutures. The hemitransfixion incision was closed with interrupted 4-0 chromic sutures.   The inferior one half of both hypertrophied inferior turbinate was crossclamped with a Kelly clamp. The inferior one half of each inferior turbinate was then resected with a pair of cross cutting scissors. Hemostasis was achieved with a suction cautery device.   Attention was then focused on the concha bullosa. Using a 0 degree scope, the left middle turbinate was medialized. The lateral half of the concha bullosa was resected with a sickle knife and trucut forceps. Hemostasis was achieved with a suction cautery device. The same procedure was repeated on the right side.  The care of the patient was turned over to the anesthesiologist. The patient was awakened from anesthesia without difficulty. The patient was extubated and transferred to the recovery room in good condition.   OPERATIVE FINDINGS: Severe nasal septal deviation and bilateral inferior turbinate hypertrophy. Bilateral concha bullosa.  SPECIMEN: None.   FOLLOWUP CARE: The patient be discharged home once she is awake and alert. The patient will be placed on Percocet 1-2 tablets p.o. q.6 hours p.r.n. pain, and amoxicillin 875 mg p.o. b.i.d. for 5 days. The patient will follow up in my office in approximately 1 week for splint removal.   Tammy Weatherly Philomena Doheny,  MD

## 2017-09-08 NOTE — Transfer of Care (Signed)
Immediate Anesthesia Transfer of Care Note  Patient: Tammy Webster  Procedure(s) Performed: Procedure(s): NASAL SEPTOPLASTY WITH TURBINATE REDUCTION (Bilateral) ENDOSCOPIC CONCHA BULLOSA RESECTION (Bilateral)  Patient Location: PACU  Anesthesia Type:General  Level of Consciousness: awake, alert  and oriented  Airway & Oxygen Therapy: Patient Spontanous Breathing and Patient connected to face mask oxygen  Post-op Assessment: Report given to RN and Post -op Vital signs reviewed and stable  Post vital signs: Reviewed and stable  Last Vitals:  Vitals:   09/08/17 0759 09/08/17 1037  BP: 125/80 (!) 151/94  Pulse: (!) 54 84  Resp: 18   Temp: 36.6 C   SpO2: 99% 100%    Last Pain:  Vitals:   09/08/17 0820  TempSrc:   PainSc: 0-No pain      Patients Stated Pain Goal: 3 (09/08/17 0820)  Complications: No apparent anesthesia complications

## 2017-09-08 NOTE — Anesthesia Procedure Notes (Addendum)
Procedure Name: Intubation Date/Time: 09/08/2017 8:56 AM Performed by: Melynda Ripple D Pre-anesthesia Checklist: Patient identified, Emergency Drugs available, Suction available and Patient being monitored Patient Re-evaluated:Patient Re-evaluated prior to induction Oxygen Delivery Method: Circle system utilized Preoxygenation: Pre-oxygenation with 100% oxygen Induction Type: IV induction Ventilation: Mask ventilation without difficulty Laryngoscope Size: Mac and 3 Grade View: Grade II Tube type: Oral Tube size: 7.0 mm Number of attempts: 1 Airway Equipment and Method: Stylet and Oral airway Placement Confirmation: ETT inserted through vocal cords under direct vision,  positive ETCO2 and breath sounds checked- equal and bilateral Secured at: 22 cm Tube secured with: Tape Dental Injury: Teeth and Oropharynx as per pre-operative assessment

## 2017-09-09 ENCOUNTER — Encounter (HOSPITAL_BASED_OUTPATIENT_CLINIC_OR_DEPARTMENT_OTHER): Payer: Self-pay | Admitting: Otolaryngology

## 2017-09-09 NOTE — Addendum Note (Signed)
Addendum  created 09/09/17 0708 by Chieko Neises D, CRNA   Charge Capture section accepted    

## 2017-09-11 ENCOUNTER — Ambulatory Visit (INDEPENDENT_AMBULATORY_CARE_PROVIDER_SITE_OTHER): Payer: BLUE CROSS/BLUE SHIELD | Admitting: Otolaryngology

## 2017-09-22 ENCOUNTER — Ambulatory Visit (INDEPENDENT_AMBULATORY_CARE_PROVIDER_SITE_OTHER): Payer: BLUE CROSS/BLUE SHIELD | Admitting: Otolaryngology

## 2017-10-13 ENCOUNTER — Ambulatory Visit (INDEPENDENT_AMBULATORY_CARE_PROVIDER_SITE_OTHER): Payer: BLUE CROSS/BLUE SHIELD | Admitting: Otolaryngology

## 2018-08-10 ENCOUNTER — Other Ambulatory Visit (HOSPITAL_COMMUNITY): Payer: Self-pay | Admitting: Internal Medicine

## 2018-08-10 DIAGNOSIS — Z1231 Encounter for screening mammogram for malignant neoplasm of breast: Secondary | ICD-10-CM

## 2018-08-12 ENCOUNTER — Ambulatory Visit (HOSPITAL_COMMUNITY)
Admission: RE | Admit: 2018-08-12 | Discharge: 2018-08-12 | Disposition: A | Discharge status: Home / Self Care | Payer: BLUE CROSS/BLUE SHIELD | Source: Ambulatory Visit | Attending: Internal Medicine | Admitting: Internal Medicine

## 2018-08-12 DIAGNOSIS — Z1231 Encounter for screening mammogram for malignant neoplasm of breast: Secondary | ICD-10-CM | POA: Diagnosis not present

## 2019-12-30 ENCOUNTER — Other Ambulatory Visit: Payer: Self-pay

## 2019-12-30 ENCOUNTER — Ambulatory Visit: Payer: BLUE CROSS/BLUE SHIELD | Attending: Internal Medicine

## 2019-12-30 DIAGNOSIS — Z20822 Contact with and (suspected) exposure to covid-19: Secondary | ICD-10-CM

## 2020-01-01 LAB — NOVEL CORONAVIRUS, NAA: SARS-CoV-2, NAA: NOT DETECTED

## 2020-01-29 ENCOUNTER — Other Ambulatory Visit: Payer: Self-pay

## 2020-01-29 ENCOUNTER — Ambulatory Visit
Admission: EM | Admit: 2020-01-29 | Discharge: 2020-01-29 | Disposition: A | Discharge status: Home / Self Care | Payer: BLUE CROSS/BLUE SHIELD

## 2020-01-29 DIAGNOSIS — U071 COVID-19: Secondary | ICD-10-CM

## 2020-01-29 DIAGNOSIS — Z20822 Contact with and (suspected) exposure to covid-19: Secondary | ICD-10-CM

## 2020-01-29 LAB — POCT INFLUENZA A/B
Influenza A, POC: NEGATIVE
Influenza B, POC: NEGATIVE

## 2020-01-29 LAB — POC SARS CORONAVIRUS 2 AG -  ED: SARS Coronavirus 2 Ag: POSITIVE — AB

## 2020-01-29 MED ORDER — FLUTICASONE PROPIONATE 50 MCG/ACT NA SUSP: 2.0000 | Freq: Every day | NASAL | 0 refills | Status: DC

## 2020-01-29 MED ORDER — CETIRIZINE HCL 10 MG PO TABS: 10.0000 mg | ORAL_TABLET | Freq: Every day | ORAL | 0 refills | Status: DC

## 2020-01-29 MED ORDER — BENZONATATE 100 MG PO CAPS: 100.0000 mg | ORAL_CAPSULE | Freq: Three times a day (TID) | ORAL | 0 refills | Status: DC

## 2020-01-29 NOTE — ED Provider Notes (Signed)
[place]   [id number] 01/29/20 Arrival Time: 5102   CC: COVID symptoms  SUBJECTIVE: History from: patient.  Tammy Webster is a 59 y.o. female who presents with sinus pain/ pressure, runny nose and congestion x 1 week, intermittent productive cough with brown sputum and SOB for the past 2 days.  Son tested positive for COVID 10 days ago.  Has tried OTC medications without relief.  Denies aggravating factors.  Reports previous symptoms in the past with the flu.  Also reports having watery to loose stools 5-6 episodes the other day, and fever, with tmax of 100.   Denies sore throat, wheezing, chest pain, nausea, vomiting, changes in bladder habits.    ROS: As per HPI.  All other pertinent ROS negative.     Past Medical History:  Diagnosis Date  . Anxiety   . Arthritis   . Chronic back pain   . Chronic constipation   . Gastric ulcer   . Headache(784.0)   . Helicobacter pylori gastritis    Treated 2013  . History of anemia   . History of bronchitis   . History of pneumonia   . Hyperlipidemia   . Insomnia   . MRSA infection    2007-2009   Past Surgical History:  Procedure Laterality Date  . CARPAL TUNNEL RELEASE Bilateral   . COLONOSCOPY  04/13/12   Rourk-anal canal hemorrhoids/normal colon,rectum  . ENDOSCOPIC CONCHA BULLOSA RESECTION Bilateral 09/08/2017   Procedure: ENDOSCOPIC CONCHA BULLOSA RESECTION;  Surgeon: Leta Baptist, MD;  Location: [facility];  Service: ENT;  Laterality: Bilateral;  . ESOPHAGOGASTRODUODENOSCOPY  04/13/12   Rourk-normal esophaus s/p dilation/h pylori gastritis  . GALLBLADDER SURGERY  2004  . MALONEY DILATION  04/13/2012   7F  . NASAL SEPTOPLASTY W/ TURBINOPLASTY Bilateral 09/08/2017   Procedure: NASAL SEPTOPLASTY WITH TURBINATE REDUCTION;  Surgeon: Leta Baptist, MD;  Location: [facility];  Service: ENT;  Laterality: Bilateral;  . SAVORY DILATION  04/13/2012  . TUBAL LIGATION     No Known Allergies No current  facility-administered medications on file prior to encounter.   Current Outpatient Medications on File Prior to Encounter  Medication Sig Dispense Refill  . meloxicam (MOBIC) 15 MG tablet Take 15 mg by mouth daily.     Social History   Socioeconomic History  . Marital status: Married    Spouse name: Not on file  . Number of children: 2  . Years of education: Not on file  . Highest education level: Not on file  Occupational History  . Not on file  Tobacco Use  . Smoking status: Former Smoker    Packs/day: 2.00    Years: 30.00    Pack years: 60.00    Types: Cigarettes  . Smokeless tobacco: Never Used  Substance and Sexual Activity  . Alcohol use: No  . Drug use: No  . Sexual activity: Not Currently    Birth control/protection: None  Other Topics Concern  . Not on file  Social History Narrative  . Not on file   Social Determinants of Health   Financial Resource Strain:   . Difficulty of Paying Living Expenses: Not on file  Food Insecurity:   . Worried About Charity fundraiser in the Last Year: Not on file  . Ran Out of Food in the Last Year: Not on file  Transportation Needs:   . Lack of Transportation (Medical): Not on file  . Lack of Transportation (Non-Medical): Not on file  Physical  Activity:   . Days of Exercise per Week: Not on file  . Minutes of Exercise per Session: Not on file  Stress:   . Feeling of Stress : Not on file  Social Connections:   . Frequency of Communication with Friends and Family: Not on file  . Frequency of Social Gatherings with Friends and Family: Not on file  . Attends Religious Services: Not on file  . Active Member of Clubs or Organizations: Not on file  . Attends Archivist Meetings: Not on file  . Marital Status: Not on file  Intimate Partner Violence:   . Fear of Current or Ex-Partner: Not on file  . Emotionally Abused: Not on file  . Physically Abused: Not on file  . Sexually Abused: Not on file   Family History    Problem Relation Age of Onset  . Colon polyps Father        heart disease - stenting, "shocked heart twice"  . Gastric cancer Father        deceased Feb 15, 2012  . Heart disease Maternal Grandmother 38  . Breast cancer Maternal Grandmother 93  . Heart disease Maternal Grandfather 65       also MI  . Heart disease Paternal Grandmother 47  . Diabetes Paternal Grandmother   . Heart disease Paternal Grandfather 4  . Heart failure Paternal Grandfather        enlarged heart  . Liver disease Neg Hx   . Colon cancer Neg Hx     OBJECTIVE:  Vitals:   01/29/20 1034  BP: (!) 142/86  Pulse: 100  Resp: 20  Temp: 98.5 F (36.9 C)  SpO2: 96%     General appearance: alert; appears mildly fatigued, but nontoxic; speaking in full sentences and tolerating own secretions HEENT: NCAT; Ears: EACs clear, TMs pearly gray; Eyes: PERRL.  EOM grossly intact. Sinuses: mildly TTP over frontal and maxillary sinuses; Nose: nares patent without rhinorrhea, Throat: oropharynx clear, tonsils non erythematous or enlarged, uvula midline  Neck: supple without LAD Lungs: unlabored respirations, symmetrical air entry; cough: moderate; no respiratory distress; CTAB Heart: regular rate and rhythm.   Skin: warm and dry Psychological: alert and cooperative; normal mood and affect  LABS:  Results for orders placed or performed during the hospital encounter of 01/29/20 (from the past 24 hour(s))  POC SARS Coronavirus 2 Ag-ED - Nasal Swab (BD Veritor Kit)     Status: Abnormal   Collection Time: 01/29/20 10:43 AM  Result Value Ref Range   SARS Coronavirus 2 Ag Positive (A) Negative  POCT Influenza A/B     Status: None   Collection Time: 01/29/20 10:43 AM  Result Value Ref Range   Influenza A, POC Negative Negative   Influenza B, POC Negative Negative     ASSESSMENT & PLAN:  1. COVID-19 virus infection   2. Suspected COVID-19 virus infection   3. Lab test positive for detection of COVID-19 virus     Meds  ordered this encounter  Medications  . cetirizine (ZYRTEC) 10 MG tablet    Sig: Take 1 tablet (10 mg total) by mouth daily.    Dispense:  30 tablet    Refill:  0    Order Specific Question:   Supervising Provider    Answer:   Raylene Everts [[id number]]  . fluticasone (FLONASE) 50 MCG/ACT nasal spray    Sig: Place 2 sprays into both nostrils daily.    Dispense:  16 g    Refill:  0    Order Specific Question:   Supervising Provider    Answer:   Raylene Everts [[id number]]  . benzonatate (TESSALON) 100 MG capsule    Sig: Take 1 capsule (100 mg total) by mouth every 8 (eight) hours.    Dispense:  21 capsule    Refill:  0    Order Specific Question:   Supervising Provider    Answer:   Raylene Everts [[id number]]   Flu negative COVID test was positive You should remain isolated in your home for 10 days from symptom onset AND greater than 72 hours after symptoms resolution (absence of fever without the use of fever-reducing medication and improvement in respiratory symptoms), whichever is longer Get plenty of rest and push fluids Use zyrtec for nasal congestion, runny nose, and/or sore throat Use flonase for nasal congestion and runny nose Tessalon perles prescribed.  Take as needed for cough Use medications daily for symptom relief Use OTC medications like ibuprofen or tylenol as needed fever or pain Follow up with PCP in 1-2 days via phone or e-visit for recheck and to ensure symptoms are improving Call or go to the ED if you have any new or worsening symptoms such as fever, worsening cough, shortness of breath, chest tightness, chest pain, turning blue, changes in mental status, etc...    Reviewed expectations re: course of current medical issues. Questions answered. Outlined signs and symptoms indicating need for more acute intervention. Patient verbalized understanding. After Visit Summary given.         Lestine Box, PA-C 01/29/20 1131

## 2020-01-29 NOTE — ED Triage Notes (Signed)
Pt has developed cough in past couple of days with some SOB. Pt de sated to 92 % but improved with mask down and deep breathing

## 2020-01-29 NOTE — ED Triage Notes (Signed)
Took tylenol at 8:30 am

## 2020-01-29 NOTE — Discharge Instructions (Signed)
Flu negative COVID test was positive You should remain isolated in your home for 10 days from symptom onset AND greater than 72 hours after symptoms resolution (absence of fever without the use of fever-reducing medication and improvement in respiratory symptoms), whichever is longer Get plenty of rest and push fluids Use zyrtec for nasal congestion, runny nose, and/or sore throat Use flonase for nasal congestion and runny nose Tessalon perles prescribed.  Take as needed for cough Use medications daily for symptom relief Use OTC medications like ibuprofen or tylenol as needed fever or pain Follow up with PCP in 1-2 days via phone or e-visit for recheck and to ensure symptoms are improving Call or go to the ED if you have any new or worsening symptoms such as fever, worsening cough, shortness of breath, chest tightness, chest pain, turning blue, changes in mental status, etc..Marland Kitchen

## 2020-01-30 ENCOUNTER — Encounter: Payer: Self-pay | Admitting: Physician Assistant

## 2020-01-30 ENCOUNTER — Other Ambulatory Visit: Payer: Self-pay | Admitting: Physician Assistant

## 2020-01-30 DIAGNOSIS — U071 COVID-19: Secondary | ICD-10-CM

## 2020-01-30 DIAGNOSIS — I10 Essential (primary) hypertension: Secondary | ICD-10-CM

## 2020-01-30 NOTE — Progress Notes (Signed)
  I connected by phone with Tammy Webster on 01/30/2020 at 8:44 AM to discuss the potential use of an new treatment for mild to moderate COVID-19 viral infection in non-hospitalized patients.  This patient is a 59 y.o. female that meets the FDA criteria for Emergency Use Authorization of bamlanivimab or casirivimab\imdevimab.  Has a (+) direct SARS-CoV-2 viral test result  Has mild or moderate COVID-19   Is ? 58 years of age and weighs ? 40 kg  Is NOT hospitalized due to COVID-19  Is NOT requiring oxygen therapy or requiring an increase in baseline oxygen flow rate due to COVID-19  Is within 10 days of symptom onset  Has at least one of the high risk factor(s) for progression to severe COVID-19 and/or hospitalization as defined in EUA.  Specific high risk criteria : BMI >/= 35 and HTN   I have spoken and communicated the following to the patient or parent/caregiver:  1. FDA has authorized the emergency use of bamlanivimab and casirivimab\imdevimab for the treatment of mild to moderate COVID-19 in adults and pediatric patients with positive results of direct SARS-CoV-2 viral testing who are 13 years of age and older weighing at least 40 kg, and who are at high risk for progressing to severe COVID-19 and/or hospitalization.  2. The significant known and potential risks and benefits of bamlanivimab and casirivimab\imdevimab, and the extent to which such potential risks and benefits are unknown.  3. Information on available alternative treatments and the risks and benefits of those alternatives, including clinical trials.  4. Patients treated with bamlanivimab and casirivimab\imdevimab should continue to self-isolate and use infection control measures (e.g., wear mask, isolate, social distance, avoid sharing personal items, clean and disinfect "high touch" surfaces, and frequent handwashing) according to CDC guidelines.   5. The patient or parent/caregiver has the option to accept or refuse  bamlanivimab or casirivimab\imdevimab .  After reviewing this information with the patient, The patient agreed to proceed with receiving the casirivimab\imdevimab infusion and will be provided a copy of the Fact sheet prior to receiving the infusion.   Pt set up for 01/31/20 @ 8:30am. Sx onset 01/24/20. Directions given .  [name] 01/30/2020 8:44 AM

## 2020-01-31 ENCOUNTER — Ambulatory Visit (HOSPITAL_COMMUNITY)
Admission: RE | Admit: 2020-01-31 | Discharge: 2020-01-31 | Disposition: A | Discharge status: Home / Self Care | Payer: BLUE CROSS/BLUE SHIELD | Source: Ambulatory Visit | Attending: Pulmonary Disease | Admitting: Pulmonary Disease

## 2020-01-31 DIAGNOSIS — U071 COVID-19: Secondary | ICD-10-CM | POA: Diagnosis present

## 2020-01-31 DIAGNOSIS — Z6835 Body mass index (BMI) 35.0-35.9, adult: Secondary | ICD-10-CM | POA: Diagnosis not present

## 2020-01-31 DIAGNOSIS — I10 Essential (primary) hypertension: Secondary | ICD-10-CM | POA: Insufficient documentation

## 2020-01-31 MED ORDER — FAMOTIDINE IN NACL 20-0.9 MG/50ML-% IV SOLN: 20.0000 mg | Freq: Once | INTRAVENOUS | Status: DC | PRN

## 2020-01-31 MED ORDER — DIPHENHYDRAMINE HCL 50 MG/ML IJ SOLN: 50.0000 mg | Freq: Once | INTRAMUSCULAR | Status: DC | PRN

## 2020-01-31 MED ORDER — SODIUM CHLORIDE 0.9 % IV SOLN
Freq: Once | INTRAVENOUS | Status: AC
  Filled 2020-01-31: qty 10

## 2020-01-31 MED ORDER — ALBUTEROL SULFATE HFA 108 (90 BASE) MCG/ACT IN AERS: 2.0000 | INHALATION_SPRAY | Freq: Once | RESPIRATORY_TRACT | Status: DC | PRN

## 2020-01-31 MED ORDER — METHYLPREDNISOLONE SODIUM SUCC 125 MG IJ SOLR: 125.0000 mg | Freq: Once | INTRAMUSCULAR | Status: DC | PRN

## 2020-01-31 MED ORDER — EPINEPHRINE 0.3 MG/0.3ML IJ SOAJ: 0.3000 mg | Freq: Once | INTRAMUSCULAR | Status: DC | PRN

## 2020-01-31 MED ORDER — SODIUM CHLORIDE 0.9 % IV SOLN
INTRAVENOUS | Status: DC | PRN
  Administered 2020-01-31: 250 mL via INTRAVENOUS

## 2020-01-31 NOTE — Discharge Instructions (Signed)

## 2020-01-31 NOTE — Progress Notes (Signed)
  Diagnosis: COVID-19  Physician:Dr Wright  Procedure: [facility] Med: casirivimab\imdevimab infusion - Provided patient with casirivimab\imdevimab fact sheet for patients, parents and caregivers prior to infusion.  Complications: No immediate complications noted.  Discharge: Discharged home   Camela Wich W 01/31/2020  

## 2020-02-04 ENCOUNTER — Ambulatory Visit (INDEPENDENT_AMBULATORY_CARE_PROVIDER_SITE_OTHER): Payer: BLUE CROSS/BLUE SHIELD

## 2020-02-04 ENCOUNTER — Ambulatory Visit: Payer: BLUE CROSS/BLUE SHIELD

## 2020-02-04 ENCOUNTER — Other Ambulatory Visit: Payer: Self-pay

## 2020-02-04 ENCOUNTER — Ambulatory Visit
Admission: EM | Admit: 2020-02-04 | Discharge: 2020-02-04 | Disposition: A | Discharge status: Home / Self Care | Payer: BLUE CROSS/BLUE SHIELD | Attending: Emergency Medicine | Admitting: Emergency Medicine

## 2020-02-04 DIAGNOSIS — U071 COVID-19: Secondary | ICD-10-CM | POA: Diagnosis not present

## 2020-02-04 DIAGNOSIS — R05 Cough: Secondary | ICD-10-CM

## 2020-02-04 DIAGNOSIS — R0602 Shortness of breath: Secondary | ICD-10-CM | POA: Diagnosis not present

## 2020-02-04 MED ORDER — PREDNISONE 10 MG PO TABS: 20.0000 mg | ORAL_TABLET | Freq: Every day | ORAL | 0 refills | Status: DC

## 2020-02-04 NOTE — Discharge Instructions (Addendum)
X-ray showed early development of pneumonia more likely viral in origin due to Covid-19  You should remain isolated in your home for 10 days from symptom onset AND greater than 72 hours after symptoms resolution (absence of fever without the use of fever-reducing medication and improvement in respiratory symptoms), whichever is longer Get plenty of rest and push fluids Short-term prednisone as prescribed Use medications daily for symptom relief Use OTC medications like ibuprofen or tylenol as needed fever or pain Call or go to the ED if you have any new or worsening symptoms such as fever, worsening cough, shortness of breath, chest tightness, chest pain, turning blue, changes in mental status, etc..Marland Kitchen

## 2020-02-04 NOTE — ED Triage Notes (Signed)
Pt received infusion therapy for covid on Monday, felt better initially and then developed severe cough and sob

## 2020-02-04 NOTE — ED Provider Notes (Signed)
[facility]    CSN: [id number] Arrival date & time: 02/04/20  0825      History   Chief Complaint Chief Complaint  Patient presents with  . Cough    HPI Tammy Webster is a 59 y.o. female.   who presents to the urgent care with a complaint of worsening cough and shortness of breath for more than a week.  Patient tested positive for COVID-19 on 01/29/2020.  Received infusion therapy at [facility] outpatient on 01/31/2020.  Denies sick exposure to  flu or strep.  Denies recent travel.  Denies aggravating or alleviating symptoms.  Report previous COVID infection.   Denies fever, chills, fatigue, nasal congestion, rhinorrhea, sore throat,  wheezing, chest pain, nausea, vomiting, changes in bowel or bladder habits.     The history is provided by the patient. No language interpreter was used.  Cough Associated symptoms: shortness of breath     Past Medical History:  Diagnosis Date  . Anxiety   . Arthritis   . Chronic back pain   . Chronic constipation   . Elevated BP without diagnosis of hypertension   . Gastric ulcer   . Headache(784.0)   . Helicobacter pylori gastritis    Treated 2013  . History of anemia   . History of bronchitis   . History of pneumonia   . Hyperlipidemia   . Insomnia   . Morbid obesity (HCC)   . MRSA infection    2007-2009    Patient Active Problem List   Diagnosis Date Noted  . Spinal stenosis, lumbar region, with neurogenic claudication 02/28/2014  . Dyslipidemia 02/11/2014  . Cardiomegaly 01/26/2014  . DOE (dyspnea on exertion) 01/26/2014  . Preoperative cardiovascular examination 01/26/2014  . Right knee pain 11/03/2012  . Low back pain 11/03/2012  . Helicobacter pylori gastritis 06/29/2012  . Hemorrhoids 06/29/2012  . Epigastric pain 04/02/2012  . GERD (gastroesophageal reflux disease) 04/02/2012  . Constipation, chronic 04/02/2012  . Esophageal dysphagia 04/02/2012  . Rectal bleeding 04/02/2012    Past Surgical  History:  Procedure Laterality Date  . CARPAL TUNNEL RELEASE Bilateral   . COLONOSCOPY  04/13/12   Rourk-anal canal hemorrhoids/normal colon,rectum  . ENDOSCOPIC CONCHA BULLOSA RESECTION Bilateral 09/08/2017   Procedure: ENDOSCOPIC CONCHA BULLOSA RESECTION;  Surgeon: Newman Pies, MD;  Location: [facility];  Service: ENT;  Laterality: Bilateral;  . ESOPHAGOGASTRODUODENOSCOPY  04/13/12   Rourk-normal esophaus s/p dilation/h pylori gastritis  . GALLBLADDER SURGERY  2004  . MALONEY DILATION  04/13/2012   43F  . NASAL SEPTOPLASTY W/ TURBINOPLASTY Bilateral 09/08/2017   Procedure: NASAL SEPTOPLASTY WITH TURBINATE REDUCTION;  Surgeon: Newman Pies, MD;  Location: [facility];  Service: ENT;  Laterality: Bilateral;  . SAVORY DILATION  04/13/2012  . TUBAL LIGATION      OB History   No obstetric history on file.      Home Medications    Prior to Admission medications   Medication Sig Start Date End Date Taking? Authorizing Provider  benzonatate (TESSALON) 100 MG capsule Take 1 capsule (100 mg total) by mouth every 8 (eight) hours. 01/29/20   Wurst, [place], PA-C  cetirizine (ZYRTEC) 10 MG tablet Take 1 tablet (10 mg total) by mouth daily. 01/29/20   Wurst, [place], PA-C  fluticasone (FLONASE) 50 MCG/ACT nasal spray Place 2 sprays into both nostrils daily. 01/29/20   Wurst, [place], PA-C  meloxicam (MOBIC) 15 MG tablet Take 15 mg by mouth daily. 12/31/19   [provider]  predniSONE (DELTASONE) 10 MG tablet Take 2 tablets (20 mg total) by mouth daily. 02/04/20   Lilyana Lippman, Zachery Dakins, FNP    Family History Family History  Problem Relation Age of Onset  . Colon polyps Father        heart disease - stenting, "shocked heart twice"  . Gastric cancer Father        deceased 04/30/12  . Heart disease Maternal Grandmother 45  . Breast cancer Maternal Grandmother 56  . Heart disease Maternal Grandfather 60       also MI  . Heart disease Paternal Grandmother 4  . Diabetes  Paternal Grandmother   . Heart disease Paternal Grandfather 46  . Heart failure Paternal Grandfather        enlarged heart  . Liver disease Neg Hx   . Colon cancer Neg Hx     Social History Social History   Tobacco Use  . Smoking status: Former Smoker    Packs/day: 2.00    Years: 30.00    Pack years: 60.00    Types: Cigarettes  . Smokeless tobacco: Never Used  Substance Use Topics  . Alcohol use: No  . Drug use: No     Allergies   Patient has no known allergies.   Review of Systems Review of Systems  Constitutional: Negative.   HENT: Negative.   Respiratory: Positive for cough and shortness of breath.   Cardiovascular: Negative.   Gastrointestinal: Negative.   Neurological: Negative.      Physical Exam Triage Vital Signs ED Triage Vitals  Enc Vitals Group     BP 02/04/20 0836 118/83     Pulse Rate 02/04/20 0836 83     Resp 02/04/20 0836 20     Temp 02/04/20 0836 98.2 F (36.8 C)     Temp src --      SpO2 02/04/20 0836 95 %     Weight --      Height --      Head Circumference --      Peak Flow --      Pain Score 02/04/20 0838 4     Pain Loc --      Pain Edu? --      Excl. in GC? --    No data found.  Updated Vital Signs BP 118/83   Pulse 83   Temp 98.2 F (36.8 C)   Resp 20   SpO2 95%   Visual Acuity Right Eye Distance:   Left Eye Distance:   Bilateral Distance:    Right Eye Near:   Left Eye Near:    Bilateral Near:     Physical Exam Vitals and nursing note reviewed.  Constitutional:      General: She is not in acute distress.    Appearance: Normal appearance. She is normal weight. She is not ill-appearing or toxic-appearing.  HENT:     Head: Normocephalic.     Right Ear: Tympanic membrane, ear canal and external ear normal. There is no impacted cerumen.     Left Ear: Tympanic membrane, ear canal and external ear normal. There is no impacted cerumen.     Nose: Nose normal. No congestion.     Mouth/Throat:     Mouth: Mucous  membranes are moist.     Pharynx: Oropharynx is clear. No oropharyngeal exudate or posterior oropharyngeal erythema.  Cardiovascular:     Rate and Rhythm: Normal rate and regular rhythm.     Pulses: Normal pulses.  Heart sounds: Normal heart sounds. No murmur.  Pulmonary:     Effort: Pulmonary effort is normal. No respiratory distress.     Breath sounds: Normal breath sounds. No wheezing or rhonchi.  Chest:     Chest wall: No tenderness.  Abdominal:     General: Abdomen is flat. Bowel sounds are normal. There is no distension.     Palpations: There is no mass.     Tenderness: There is no abdominal tenderness.  Skin:    Capillary Refill: Capillary refill takes less than 2 seconds.  Neurological:     General: No focal deficit present.     Mental Status: She is alert and oriented to person, place, and time.      UC Treatments / Results  Labs (all labs ordered are listed, but only abnormal results are displayed) Labs Reviewed - No data to display  EKG   Radiology DG Chest 2 View  Result Date: 02/04/2020 CLINICAL DATA:  Cough and shortness of breath EXAM: CHEST - 2 VIEW COMPARISON:  February 21, 2014 FINDINGS: There is ill-defined opacity in the right middle lobe region. There is mild left base atelectasis. Lungs elsewhere are clear. Heart size and pulmonary vascularity are normal. No adenopathy. No bone lesions. IMPRESSION: Ill-defined opacity in the right middle lobe region, likely representing early developing pneumonia. Mild left base atelectasis. Stable cardiac silhouette. No evident adenopathy. These results will be called to the ordering clinician or representative by the Radiologist Assistant, and communication documented in the PACS or zVision Dashboard. Electronically Signed   By: Bretta Bang III M.D.   On: 02/04/2020 09:01    Procedures Procedures (including critical care time)  Medications Ordered in UC Medications - No data to display  Initial Impression /  Assessment and Plan / UC Course  I have reviewed the triage vital signs and the nursing notes.  Pertinent labs & imaging results that were available during my care of the patient were reviewed by me and considered in my medical decision making (see chart for details).     COVID-19 infection Chest x-ray was ordered and results show opacity in the right middle lobe region, likely representing early development pneumonia more likely viral.  I have reviewed the x-ray myself and the radiologist interpretation.  I am in agreement with the radiologist interpretation.  Advised patient to remain quarantine  Short-term prednisone was prescribed To go to ED for worsening of symptoms  Final Clinical Impressions(s) / UC Diagnoses   Final diagnoses:  COVID-19 virus infection     Discharge Instructions     X-ray showed early development of pneumonia more likely viral in origin due to Covid-19 You should remain isolated in your home for 10 days from symptom onset AND greater than 72 hours after symptoms resolution (absence of fever without the use of fever-reducing medication and improvement in respiratory symptoms), whichever is longer Get plenty of rest and push fluids Short-term prednisone as prescribed Use medications daily for symptom relief Use OTC medications like ibuprofen or tylenol as needed fever or pain Call or go to the ED if you have any new or worsening symptoms such as fever, worsening cough, shortness of breath, chest tightness, chest pain, turning blue, changes in mental status, etc...     ED Prescriptions    Medication Sig Dispense Auth. Provider   predniSONE (DELTASONE) 10 MG tablet Take 2 tablets (20 mg total) by mouth daily. 15 tablet Daemian Gahm, Zachery Dakins, FNP     PDMP not reviewed  this encounter.   Emerson Monte, [place] 02/04/20 [phone]

## 2020-04-24 ENCOUNTER — Ambulatory Visit
Admission: EM | Admit: 2020-04-24 | Discharge: 2020-04-24 | Disposition: A | Discharge status: Home / Self Care | Payer: BLUE CROSS/BLUE SHIELD | Attending: Emergency Medicine | Admitting: Emergency Medicine

## 2020-04-24 ENCOUNTER — Other Ambulatory Visit: Payer: Self-pay

## 2020-04-24 ENCOUNTER — Encounter: Payer: Self-pay | Admitting: Emergency Medicine

## 2020-04-24 DIAGNOSIS — H6593 Unspecified nonsuppurative otitis media, bilateral: Secondary | ICD-10-CM

## 2020-04-24 DIAGNOSIS — M545 Low back pain, unspecified: Secondary | ICD-10-CM

## 2020-04-24 LAB — POCT URINALYSIS DIP (MANUAL ENTRY)
Bilirubin, UA: NEGATIVE
Blood, UA: NEGATIVE
Glucose, UA: NEGATIVE mg/dL
Ketones, POC UA: NEGATIVE mg/dL
Leukocytes, UA: NEGATIVE
Nitrite, UA: NEGATIVE
Protein Ur, POC: NEGATIVE mg/dL
Spec Grav, UA: 1.025 (ref 1.010–1.025)
Urobilinogen, UA: 0.2 E.U./dL
pH, UA: 5 (ref 5.0–8.0)

## 2020-04-24 MED ORDER — CYCLOBENZAPRINE HCL 7.5 MG PO TABS: 7.5000 mg | ORAL_TABLET | Freq: Three times a day (TID) | ORAL | 0 refills | Status: DC | PRN

## 2020-04-24 MED ORDER — PREDNISONE 10 MG PO TABS: 20.0000 mg | ORAL_TABLET | Freq: Every day | ORAL | 0 refills | Status: DC

## 2020-04-24 MED ORDER — IBUPROFEN 800 MG PO TABS: 800.0000 mg | ORAL_TABLET | Freq: Three times a day (TID) | ORAL | 0 refills | Status: DC

## 2020-04-24 MED ORDER — CYCLOBENZAPRINE HCL 10 MG PO TABS: 10.0000 mg | ORAL_TABLET | Freq: Two times a day (BID) | ORAL | 0 refills | Status: DC | PRN

## 2020-04-24 NOTE — Discharge Instructions (Addendum)
Ear pain Symptoms likely from allergy reaction Continue to take Flonase, Zyrtec as prescribed/may take 1 to 2 nasal sprays of Flonase daily for the next 3 to 4 days  Back pain  POCT urine analysis was negative for UTI/urine sample will be sent for culture.  Someone will call if result is abnormal.  Rest, ice and heat as needed Ensure adequate ROM as tolerated. Prescribed ibuprofen as needed for pain relief Prescribed flexeril  for muscle spasm.  Do not drive or operate heavy machinery while taking this medication Prescribed short-term prednisone for inflammation Return here or go to ER if you have any new or worsening symptoms such as numbness/tingling of the inner thighs, loss of bladder or bowel control, headache/blurry vision, nausea/vomiting, confusion/altered mental status, dizziness, weakness, passing out, imbalance, etc..Marland Kitchen

## 2020-04-24 NOTE — ED Triage Notes (Signed)
Right ear pain for one week.  Intermittent pain  Pain in lower back.  Denies abdominal pain.  Denies urinary symptoms, but wants urine checked

## 2020-04-24 NOTE — ED Provider Notes (Signed)
[facility]    CSN: [id number] Arrival date & time: 04/24/20  0857      History   Chief Complaint Chief Complaint  Patient presents with  . Otalgia    HPI Tammy Webster is a 59 y.o. female.   Who presented to the urgent care with a complaint of bilateral ear pressure and pain and low back pain for the past week.  Denies any precipitating event.  Localized pressure and pain to her bilateral ear and pain to her lower back.  Described ear pain as pressure and mild ache.  Described back pain as achy in character.  Has tried OTC Aleve with mild relief of back pain, Zyrtec and Flonase with mild relief for ear pain.  Back pain are made worse with range of motion.  Denies similar symptoms in the past.Denies chills, fever, nausea, vomiting, diarrhea.  The history is provided by the patient. No language interpreter was used.  Otalgia   Past Medical History:  Diagnosis Date  . Anxiety   . Arthritis   . Chronic back pain   . Chronic constipation   . Elevated BP without diagnosis of hypertension   . Gastric ulcer   . Headache(784.0)   . Helicobacter pylori gastritis    Treated 2013  . History of anemia   . History of bronchitis   . History of pneumonia   . Hyperlipidemia   . Insomnia   . Morbid obesity (HCC)   . MRSA infection    2007-2009    Patient Active Problem List   Diagnosis Date Noted  . Spinal stenosis, lumbar region, with neurogenic claudication 02/28/2014  . Dyslipidemia 02/11/2014  . Cardiomegaly 01/26/2014  . DOE (dyspnea on exertion) 01/26/2014  . Preoperative cardiovascular examination 01/26/2014  . Right knee pain 11/03/2012  . Low back pain 11/03/2012  . Helicobacter pylori gastritis 06/29/2012  . Hemorrhoids 06/29/2012  . Epigastric pain 04/02/2012  . GERD (gastroesophageal reflux disease) 04/02/2012  . Constipation, chronic 04/02/2012  . Esophageal dysphagia 04/02/2012  . Rectal bleeding 04/02/2012    Past Surgical History:  Procedure  Laterality Date  . CARPAL TUNNEL RELEASE Bilateral   . COLONOSCOPY  04/13/12   Rourk-anal canal hemorrhoids/normal colon,rectum  . ENDOSCOPIC CONCHA BULLOSA RESECTION Bilateral 09/08/2017   Procedure: ENDOSCOPIC CONCHA BULLOSA RESECTION;  Surgeon: Newman Pies, MD;  Location: [facility];  Service: ENT;  Laterality: Bilateral;  . ESOPHAGOGASTRODUODENOSCOPY  04/13/12   Rourk-normal esophaus s/p dilation/h pylori gastritis  . GALLBLADDER SURGERY  2004  . MALONEY DILATION  04/13/2012   63F  . NASAL SEPTOPLASTY W/ TURBINOPLASTY Bilateral 09/08/2017   Procedure: NASAL SEPTOPLASTY WITH TURBINATE REDUCTION;  Surgeon: Newman Pies, MD;  Location:  SUR[facility]ERY C[facility];  Service: ENT;  Laterality: Bilateral;  . SAVORY DILATION  04/13/2012  . TUBAL LIGATION      OB History   No obstetric history on file.      Home Medications    Prior to Admission medications   Medication Sig Start Date End Date Taking? Authorizing Provider  cetirizine (ZYRTEC) 10 MG tablet Take 1 tablet (10 mg total) by mouth daily. 01/29/20  Yes Wurst, [place], PA-C  fluticasone (FLONASE) 50 MCG/ACT nasal spray Place 2 sprays into both nostrils daily. 01/29/20  Yes Wurst, [place], PA-C  meloxicam (MOBIC) 15 MG tablet Take 15 mg by mouth daily. 12/31/19  Yes [provider]  benzonatate (TESSALON) 100 MG capsule Take 1 capsule (100 mg total) by mouth every 8 (eight) hours.  01/29/20   Wurst, [place], PA-C  predniSONE (DELTASONE) 10 MG tablet Take 2 tablets (20 mg total) by mouth daily. 02/04/20   Prajna Vanderpool, Zachery Dakins, FNP    Family History Family History  Problem Relation Age of Onset  . Colon polyps Father        heart disease - stenting, "shocked heart twice"  . Gastric cancer Father        deceased 04/26/2012  . Heart disease Maternal Grandmother 45  . Breast cancer Maternal Grandmother 84  . Heart disease Maternal Grandfather 60       also MI  . Heart disease Paternal Grandmother 95  . Diabetes Paternal  Grandmother   . Heart disease Paternal Grandfather 38  . Heart failure Paternal Grandfather        enlarged heart  . Liver disease Neg Hx   . Colon cancer Neg Hx     Social History Social History   Tobacco Use  . Smoking status: Former Smoker    Packs/day: 2.00    Years: 30.00    Pack years: 60.00    Types: Cigarettes  . Smokeless tobacco: Never Used  Substance Use Topics  . Alcohol use: No  . Drug use: No     Allergies   Patient has no known allergies.   Review of Systems Review of Systems  Constitutional: Negative.   HENT: Positive for ear pain.   Respiratory: Negative.   Cardiovascular: Negative.   Musculoskeletal: Positive for back pain.  All other systems reviewed and are negative.    Physical Exam Triage Vital Signs ED Triage Vitals  Enc Vitals Group     BP 04/24/20 0909 (!) 141/91     Pulse Rate 04/24/20 0909 78     Resp 04/24/20 0909 16     Temp 04/24/20 0909 98.1 F (36.7 C)     Temp Source 04/24/20 0909 Oral     SpO2 04/24/20 0909 98 %     Weight --      Height --      Head Circumference --      Peak Flow --      Pain Score 04/24/20 0928 4     Pain Loc --      Pain Edu? --      Excl. in GC? --    No data found.  Updated Vital Signs BP (!) 141/91 (BP Location: Right Arm)   Pulse 78   Temp 98.1 F (36.7 C) (Oral)   Resp 16   SpO2 98%   Visual Acuity Right Eye Distance:   Left Eye Distance:   Bilateral Distance:    Right Eye Near:   Left Eye Near:    Bilateral Near:     Physical Exam Vitals and nursing note reviewed.  Constitutional:      General: She is not in acute distress.    Appearance: Normal appearance. She is normal weight. She is not ill-appearing, toxic-appearing or diaphoretic.  HENT:     Head: Normocephalic.     Right Ear: Ear canal and external ear normal. A middle ear effusion is present.     Left Ear: Ear canal and external ear normal. A middle ear effusion is present.  Cardiovascular:     Rate and Rhythm:  Normal rate and regular rhythm.     Pulses: Normal pulses.     Heart sounds: Normal heart sounds. No murmur. No friction rub. No gallop.   Pulmonary:     Effort: Pulmonary effort  is normal. No respiratory distress.     Breath sounds: Normal breath sounds. No stridor. No wheezing, rhonchi or rales.  Chest:     Chest wall: No tenderness.  Musculoskeletal:        General: Tenderness present.     Lumbar back: Spasms and tenderness present.  Neurological:     General: No focal deficit present.     Mental Status: She is alert and oriented to person, place, and time.     Cranial Nerves: No cranial nerve deficit.     Sensory: No sensory deficit.     Motor: No weakness.     Coordination: Coordination normal.     Gait: Gait normal.     Deep Tendon Reflexes: Reflexes normal.      UC Treatments / Results  Labs (all labs ordered are listed, but only abnormal results are displayed) Labs Reviewed  URINE CULTURE  POCT URINALYSIS DIP (MANUAL ENTRY)    EKG   Radiology No results found.  Procedures Procedures (including critical care time)  Medications Ordered in UC Medications - No data to display  Initial Impression / Assessment and Plan / UC Course  I have reviewed the triage vital signs and the nursing notes.  Pertinent labs & imaging results that were available during my care of the patient were reviewed by me and considered in my medical decision making (see chart for details).     Patient is stable at discharge.  Ear pain likely from allergy reaction.  She was advised to continue to use Flonase and Zyrtec as prescribed. Back pain is musculature in nature as urine analysis was negative for UTI.  Prednisone, ibuprofen, and Flexeril were prescribed.  Work note was given   Final Clinical Impressions(s) / UC Diagnoses   Final diagnoses:  Fluid level behind tympanic membrane of both ears  Acute bilateral low back pain without sciatica     Discharge Instructions     Ear  pain Symptoms likely from allergy reaction Continue to take Flonase, Zyrtec as prescribed/may take 1 to 2 nasal sprays of Flonase daily for the next 3 to 4 days  Back pain  POCT urine analysis was negative for UTI/urine sample will be sent for culture.  Someone will call if result is abnormal.  Rest, ice and heat as needed Ensure adequate ROM as tolerated. Prescribed ibuprofen as needed for pain relief Prescribed flexeril  for muscle spasm.  Do not drive or operate heavy machinery while taking this medication Prescribed short-term prednisone for inflammation Return here or go to ER if you have any new or worsening symptoms such as numbness/tingling of the inner thighs, loss of bladder or bowel control, headache/blurry vision, nausea/vomiting, confusion/altered mental status, dizziness, weakness, passing out, imbalance, etc...      ED Prescriptions    None     PDMP not reviewed this encounter.   Emerson Monte, FNP 04/24/20 1006

## 2020-04-25 LAB — URINE CULTURE

## 2020-07-12 ENCOUNTER — Telehealth: Payer: Self-pay | Admitting: Orthopaedic Surgery

## 2020-07-12 NOTE — Telephone Encounter (Signed)
I called she has OA.

## 2020-07-12 NOTE — Telephone Encounter (Signed)
Patient called asking for a call back. Patient stated she needed to know what type of arthritis she has. Please call patient about this matter at [phone].

## 2020-07-12 NOTE — Telephone Encounter (Signed)
Can you advise?  You last saw patient in 2016.  Records in [facility].

## 2020-07-13 NOTE — Telephone Encounter (Signed)
noted 

## 2020-10-09 ENCOUNTER — Ambulatory Visit
Admission: EM | Admit: 2020-10-09 | Discharge: 2020-10-09 | Disposition: A | Discharge status: Home / Self Care | Payer: BLUE CROSS/BLUE SHIELD | Attending: Emergency Medicine | Admitting: Emergency Medicine

## 2020-10-09 ENCOUNTER — Encounter: Payer: Self-pay | Admitting: Emergency Medicine

## 2020-10-09 ENCOUNTER — Other Ambulatory Visit: Payer: Self-pay

## 2020-10-09 DIAGNOSIS — J0110 Acute frontal sinusitis, unspecified: Secondary | ICD-10-CM

## 2020-10-09 MED ORDER — PREDNISONE 10 MG PO TABS: 20.0000 mg | ORAL_TABLET | Freq: Every day | ORAL | 0 refills | Status: DC

## 2020-10-09 MED ORDER — AMOXICILLIN-POT CLAVULANATE 875-125 MG PO TABS: 1.0000 | ORAL_TABLET | Freq: Two times a day (BID) | ORAL | 0 refills | Status: DC

## 2020-10-09 NOTE — ED Provider Notes (Signed)
[facility]   [id number] 10/09/20 Arrival Time: 1031   Chief Complaint  Patient presents with  . Facial Pain     SUBJECTIVE: History from: patient.  Tammy Webster is a 59 y.o. female who presented to the urgent care for complaint of sinus pain and pressure and right ear pain for the past 2 weeks.  Denies sick exposure to COVID, flu or strep or common cold.  Denies recent travel.  Has tried OTC medication without relief.  Symptoms are made worse with lying down.  Report previous symptoms in the past.   Denies fever, chills, fatigue, rhinorrhea, sore throat, SOB, wheezing, chest pain, nausea, changes in bowel or bladder habits.      ROS: As per HPI.  All other pertinent ROS negative.     Past Medical History:  Diagnosis Date  . Anxiety   . Arthritis   . Chronic back pain   . Chronic constipation   . Elevated BP without diagnosis of hypertension   . Gastric ulcer   . Headache(784.0)   . Helicobacter pylori gastritis    Treated 2013  . History of anemia   . History of bronchitis   . History of pneumonia   . Hyperlipidemia   . Insomnia   . Morbid obesity (HCC)   . MRSA infection    2007-2009   Past Surgical History:  Procedure Laterality Date  . CARPAL TUNNEL RELEASE Bilateral   . COLONOSCOPY  04/13/12   Rourk-anal canal hemorrhoids/normal colon,rectum  . ENDOSCOPIC CONCHA BULLOSA RESECTION Bilateral 09/08/2017   Procedure: ENDOSCOPIC CONCHA BULLOSA RESECTION;  Surgeon: Newman Pies, MD;  Location: [facility];  Service: ENT;  Laterality: Bilateral;  . ESOPHAGOGASTRODUODENOSCOPY  04/13/12   Rourk-normal esophaus s/p dilation/h pylori gastritis  . GALLBLADDER SURGERY  2004  . MALONEY DILATION  04/13/2012   11F  . NASAL SEPTOPLASTY W/ TURBINOPLASTY Bilateral 09/08/2017   Procedure: NASAL SEPTOPLASTY WITH TURBINATE REDUCTION;  Surgeon: Newman Pies, MD;  Location: [facility];  Service: ENT;  Laterality: Bilateral;  . SAVORY DILATION   04/13/2012  . TUBAL LIGATION     No Known Allergies No current facility-administered medications on file prior to encounter.   Current Outpatient Medications on File Prior to Encounter  Medication Sig Dispense Refill  . benzonatate (TESSALON) 100 MG capsule Take 1 capsule (100 mg total) by mouth every 8 (eight) hours. 21 capsule 0  . cetirizine (ZYRTEC) 10 MG tablet Take 1 tablet (10 mg total) by mouth daily. 30 tablet 0  . cyclobenzaprine (FLEXERIL) 10 MG tablet Take 1 tablet (10 mg total) by mouth 2 (two) times daily as needed for muscle spasms. 20 tablet 0  . fluticasone (FLONASE) 50 MCG/ACT nasal spray Place 2 sprays into both nostrils daily. 16 g 0  . ibuprofen (ADVIL) 800 MG tablet Take 1 tablet (800 mg total) by mouth 3 (three) times daily. 21 tablet 0  . meloxicam (MOBIC) 15 MG tablet Take 15 mg by mouth daily.     Social History   Socioeconomic History  . Marital status: Married    Spouse name: Not on file  . Number of children: 2  . Years of education: Not on file  . Highest education level: Not on file  Occupational History  . Not on file  Tobacco Use  . Smoking status: Former Smoker    Packs/day: 2.00    Years: 30.00    Pack years: 60.00    Types: Cigarettes  . Smokeless  tobacco: Never Used  Substance and Sexual Activity  . Alcohol use: No  . Drug use: No  . Sexual activity: Not Currently    Birth control/protection: None  Other Topics Concern  . Not on file  Social History Narrative  . Not on file   Social Determinants of Health   Financial Resource Strain:   . Difficulty of Paying Living Expenses: Not on file  Food Insecurity:   . Worried About Programme researcher, broadcasting/film/video in the Last Year: Not on file  . Ran Out of Food in the Last Year: Not on file  Transportation Needs:   . Lack of Transportation (Medical): Not on file  . Lack of Transportation (Non-Medical): Not on file  Physical Activity:   . Days of Exercise per Week: Not on file  . Minutes of Exercise  per Session: Not on file  Stress:   . Feeling of Stress : Not on file  Social Connections:   . Frequency of Communication with Friends and Family: Not on file  . Frequency of Social Gatherings with Friends and Family: Not on file  . Attends Religious Services: Not on file  . Active Member of Clubs or Organizations: Not on file  . Attends Banker Meetings: Not on file  . Marital Status: Not on file  Intimate Partner Violence:   . Fear of Current or Ex-Partner: Not on file  . Emotionally Abused: Not on file  . Physically Abused: Not on file  . Sexually Abused: Not on file   Family History  Problem Relation Age of Onset  . Colon polyps Father        heart disease - stenting, "shocked heart twice"  . Gastric cancer Father        deceased 04-05-2012  . Heart disease Maternal Grandmother 45  . Breast cancer Maternal Grandmother 26  . Heart disease Maternal Grandfather 60       also MI  . Heart disease Paternal Grandmother 58  . Diabetes Paternal Grandmother   . Heart disease Paternal Grandfather 38  . Heart failure Paternal Grandfather        enlarged heart  . Liver disease Neg Hx   . Colon cancer Neg Hx     OBJECTIVE:  Vitals:   10/09/20 1044  BP: (!) 155/87  Pulse: 76  Resp: 18  Temp: 97.7 F (36.5 C)  TempSrc: Oral  SpO2: 97%  Weight: 240 lb (108.9 kg)  Height: 5\' 6"  (1.676 m)     Physical Exam Vitals and nursing note reviewed.  Constitutional:      General: She is not in acute distress.    Appearance: Normal appearance. She is normal weight. She is not ill-appearing, toxic-appearing or diaphoretic.  HENT:     Head: Normocephalic.     Nose: Congestion present.     Right Sinus: Frontal sinus tenderness present.     Left Sinus: Frontal sinus tenderness present.  Cardiovascular:     Rate and Rhythm: Normal rate and regular rhythm.     Pulses: Normal pulses.     Heart sounds: Normal heart sounds. No murmur heard.  No friction rub. No gallop.     Pulmonary:     Effort: Pulmonary effort is normal. No respiratory distress.     Breath sounds: Normal breath sounds. No stridor. No wheezing, rhonchi or rales.  Chest:     Chest wall: No tenderness.  Neurological:     Mental Status: She is alert and oriented to  person, place, and time.     LABS:  No results found for this or any previous visit (from the past 24 hour(s)).   ASSESSMENT & PLAN:  1. Acute non-recurrent frontal sinusitis     Meds ordered this encounter  Medications  . amoxicillin-clavulanate (AUGMENTIN) 875-125 MG tablet    Sig: Take 1 tablet by mouth every 12 (twelve) hours.    Dispense:  14 tablet    Refill:  0  . predniSONE (DELTASONE) 10 MG tablet    Sig: Take 2 tablets (20 mg total) by mouth daily.    Dispense:  15 tablet    Refill:  0    Discharge instructions   Rest and push fluids Continue Zyrtec and Flonase.  Use daily for symptomatic relief Prednisone was prescribed Augmentin prescribed.  Take as directed and to completion Continue with OTC ibuprofen/tylenol as needed for pain Follow up with PCP or [facility] if symptoms persists Return or go to the ED if you have any new or worsening symptoms such as fever, chills, worsening sinus pain/pressure, cough, sore throat, chest pain, shortness of breath, abdominal pain, changes in bowel or bladder habits, etc...    Reviewed expectations re: course of current medical issues. Questions answered. Outlined signs and symptoms indicating need for more acute intervention. Patient verbalized understanding. After Visit Summary given.         Durward Parcel, FNP 10/09/20 1143

## 2020-10-09 NOTE — ED Triage Notes (Signed)
Sinus pain and pressure and RT ear pain x 2 weeks. Also reports knot to RT shin that is sore to the touch. Denies any injury.

## 2020-10-09 NOTE — Discharge Instructions (Addendum)
Rest and push fluids Continue Zyrtec.  Use daily for symptomatic relief Prednisone was prescribed Augmentin prescribed.  Take as directed and to completion Continue with OTC ibuprofen/tylenol as needed for pain Follow up with PCP or [facility] if symptoms persists Return or go to the ED if you have any new or worsening symptoms such as fever, chills, worsening sinus pain/pressure, cough, sore throat, chest pain, shortness of breath, abdominal pain, changes in bowel or bladder habits, etc..Marland Kitchen

## 2020-11-30 ENCOUNTER — Other Ambulatory Visit (HOSPITAL_COMMUNITY): Payer: Self-pay | Admitting: Internal Medicine

## 2020-11-30 DIAGNOSIS — Z1231 Encounter for screening mammogram for malignant neoplasm of breast: Secondary | ICD-10-CM

## 2020-12-20 ENCOUNTER — Ambulatory Visit (HOSPITAL_COMMUNITY)
Admission: RE | Admit: 2020-12-20 | Discharge: 2020-12-20 | Disposition: A | Discharge status: Home / Self Care | Payer: BLUE CROSS/BLUE SHIELD | Source: Ambulatory Visit | Attending: Internal Medicine | Admitting: Internal Medicine

## 2020-12-20 ENCOUNTER — Other Ambulatory Visit: Payer: Self-pay

## 2020-12-20 DIAGNOSIS — Z1231 Encounter for screening mammogram for malignant neoplasm of breast: Secondary | ICD-10-CM | POA: Diagnosis not present

## 2020-12-21 ENCOUNTER — Ambulatory Visit (HOSPITAL_COMMUNITY): Payer: BLUE CROSS/BLUE SHIELD

## 2021-05-03 ENCOUNTER — Ambulatory Visit
Admission: EM | Admit: 2021-05-03 | Discharge: 2021-05-03 | Disposition: A | Discharge status: Home / Self Care | Payer: BLUE CROSS/BLUE SHIELD | Attending: Emergency Medicine | Admitting: Emergency Medicine

## 2021-05-03 ENCOUNTER — Other Ambulatory Visit: Payer: Self-pay

## 2021-05-03 ENCOUNTER — Encounter: Payer: Self-pay | Admitting: Emergency Medicine

## 2021-05-03 DIAGNOSIS — J3489 Other specified disorders of nose and nasal sinuses: Secondary | ICD-10-CM

## 2021-05-03 DIAGNOSIS — G43009 Migraine without aura, not intractable, without status migrainosus: Secondary | ICD-10-CM

## 2021-05-03 MED ORDER — KETOROLAC TROMETHAMINE 30 MG/ML IJ SOLN
30.0000 mg | Freq: Once | INTRAMUSCULAR | Status: AC
  Administered 2021-05-03: 30 mg via INTRAMUSCULAR

## 2021-05-03 MED ORDER — CEFTRIAXONE SODIUM 1 G IJ SOLR
1.0000 g | Freq: Once | INTRAMUSCULAR | Status: AC
  Administered 2021-05-03: 1 g via INTRAMUSCULAR

## 2021-05-03 MED ORDER — DOXYCYCLINE HYCLATE 100 MG PO CAPS: 100.0000 mg | ORAL_CAPSULE | Freq: Two times a day (BID) | ORAL | 0 refills | Status: DC

## 2021-05-03 MED ORDER — DEXAMETHASONE SODIUM PHOSPHATE 10 MG/ML IJ SOLN
10.0000 mg | Freq: Once | INTRAMUSCULAR | Status: AC
  Administered 2021-05-03: 10 mg via INTRAMUSCULAR

## 2021-05-03 NOTE — ED Provider Notes (Signed)
[facility]   [id number] 05/03/21 Arrival Time: 0836  FI:EPPIRJJO; sinus pain  SUBJECTIVE:  Tammy Webster is a 60 y.o. female who complains of persistent migraine x 3 days, and sinus pain/ pressure x 6 days.  Denies a precipitating event, or recent head trauma.  Patient localizes her pain to the top of head.  Describes the pain as intermittent and throbbing in character.  Patient has tried OTC medications without relief. Currently being treated for sinus infection with amoxicillin.  Received steroid shot 6 days ago as well. Denies alleviating or aggravating facotrs. This is not the worst headache of their life.  Patient denies fever, chills, nausea, vomiting, chest pain, SOB, abdominal pain, weakness, numbness or tingling, slurred speech.     ROS: As per HPI.  All other pertinent ROS negative.     Past Medical History:  Diagnosis Date  . Anxiety   . Arthritis   . Chronic back pain   . Chronic constipation   . Elevated BP without diagnosis of hypertension   . Gastric ulcer   . Headache(784.0)   . Helicobacter pylori gastritis    Treated 2013  . History of anemia   . History of bronchitis   . History of pneumonia   . Hyperlipidemia   . Insomnia   . Morbid obesity (HCC)   . MRSA infection    2007-2009   Past Surgical History:  Procedure Laterality Date  . CARPAL TUNNEL RELEASE Bilateral   . COLONOSCOPY  04/13/12   Rourk-anal canal hemorrhoids/normal colon,rectum  . ENDOSCOPIC CONCHA BULLOSA RESECTION Bilateral 09/08/2017   Procedure: ENDOSCOPIC CONCHA BULLOSA RESECTION;  Surgeon: Newman Pies, MD;  Location: [facility];  Service: ENT;  Laterality: Bilateral;  . ESOPHAGOGASTRODUODENOSCOPY  04/13/12   Rourk-normal esophaus s/p dilation/h pylori gastritis  . GALLBLADDER SURGERY  2004  . MALONEY DILATION  04/13/2012   38F  . NASAL SEPTOPLASTY W/ TURBINOPLASTY Bilateral 09/08/2017   Procedure: NASAL SEPTOPLASTY WITH TURBINATE REDUCTION;  Surgeon: Newman Pies, MD;   Location: [facility];  Service: ENT;  Laterality: Bilateral;  . SAVORY DILATION  04/13/2012  . TUBAL LIGATION     No Known Allergies No current facility-administered medications on file prior to encounter.   Current Outpatient Medications on File Prior to Encounter  Medication Sig Dispense Refill  . [DISCONTINUED] cetirizine (ZYRTEC) 10 MG tablet Take 1 tablet (10 mg total) by mouth daily. 30 tablet 0  . [DISCONTINUED] fluticasone (FLONASE) 50 MCG/ACT nasal spray Place 2 sprays into both nostrils daily. 16 g 0   Social History   Socioeconomic History  . Marital status: Married    Spouse name: Not on file  . Number of children: 2  . Years of education: Not on file  . Highest education level: Not on file  Occupational History  . Not on file  Tobacco Use  . Smoking status: Former Smoker    Packs/day: 2.00    Years: 30.00    Pack years: 60.00    Types: Cigarettes  . Smokeless tobacco: Never Used  Substance and Sexual Activity  . Alcohol use: No  . Drug use: No  . Sexual activity: Not Currently    Birth control/protection: None  Other Topics Concern  . Not on file  Social History Narrative  . Not on file   Social Determinants of Health   Financial Resource Strain: Not on file  Food Insecurity: Not on file  Transportation Needs: Not on file  Physical Activity: Not  on file  Stress: Not on file  Social Connections: Not on file  Intimate Partner Violence: Not on file   Family History  Problem Relation Age of Onset  . Colon polyps Father        heart disease - stenting, "shocked heart twice"  . Gastric cancer Father        deceased Apr 16, 2012  . Heart disease Maternal Grandmother 45  . Breast cancer Maternal Grandmother 87  . Heart disease Maternal Grandfather 60       also MI  . Heart disease Paternal Grandmother 71  . Diabetes Paternal Grandmother   . Heart disease Paternal Grandfather 33  . Heart failure Paternal Grandfather        enlarged heart  .  Liver disease Neg Hx   . Colon cancer Neg Hx     OBJECTIVE:  Vitals:   05/03/21 0857 05/03/21 0900  BP:  122/84  Pulse:  86  Resp:  18  Temp: 97.8 F (36.6 C)   TempSrc: Oral   SpO2:  97%    General appearance: alert; no distress Eyes: PERRLA; EOMI HENT: normocephalic; atraumatic; nares patent; EACs clear, TMs pearly gray; oropharynx clear Neck: supple with FROM Lungs: clear to auscultation bilaterally Heart: regular rate and rhythm.   Extremities: no edema; symmetrical with no gross deformities Skin: warm and dry Neurologic: CN 2-12 grossly intact; finger to nose without difficulty; normal gait; strength and sensation intact bilaterally about the upper and lower extremities; negative pronator drift Psychological: alert and cooperative; normal mood and affect   ASSESSMENT & PLAN:  1. Migraine without aura and without status migrainosus, not intractable   2. Sinus pressure     Meds ordered this encounter  Medications  . doxycycline (VIBRAMYCIN) 100 MG capsule    Sig: Take 1 capsule (100 mg total) by mouth 2 (two) times daily.    Dispense:  20 capsule    Refill:  0    Order Specific Question:   Supervising Provider    Answer:   Eustace Moore [[id number]]  . ketorolac (TORADOL) 30 MG/ML injection 30 mg  . dexamethasone (DECADRON) injection 10 mg  . cefTRIAXone (ROCEPHIN) injection 1 g   Unable to rule out brain bleed or clot in urgent care setting.  Offered patient further evaluation and management in the ED.  Patient declines at this time and would like to try outpatient therapy first.  Aware of the risk associated with this decision including missed diagnosis, organ damage, organ failure, and/or death.  Patient aware and in agreement.     Rocephin injection given in office Doxycycline prescribed.    Migraine cocktail given in office Rest and drink plenty of fluids Use OTC medications as needed for symptomatic relief Follow up with PCP if symptoms  persists Return or go to the ER if you have any new or worsening symptoms such as fever, chills, nausea, vomiting, chest pain, shortness of breath, cough, vision changes, worsening headache despite treatment, slurred speech, facial asymmetry, weakness in arms or legs, etc...  Reviewed expectations re: course of current medical issues. Questions answered. Outlined signs and symptoms indicating need for more acute intervention. Patient verbalized understanding. After Visit Summary given.   Rennis Harding, PA-C 05/03/21 [phone]

## 2021-05-03 NOTE — ED Triage Notes (Addendum)
Pt got Steroid shot and amoxicillin last Friday for sinus infection. This Monday had headache Went to work yesterday started having headache again with RT ear pain that went into neck. Pt is still taking her abx rx

## 2021-05-03 NOTE — Discharge Instructions (Signed)
Unable to rule out brain bleed or clot in urgent care setting.  Offered patient further evaluation and management in the ED.  Patient declines at this time and would like to try outpatient therapy first.  Aware of the risk associated with this decision including missed diagnosis, organ damage, organ failure, and/or death.  Patient aware and in agreement.     Rocephin injection given in office Doxycycline prescribed.    Migraine cocktail given in office Rest and drink plenty of fluids Use OTC medications as needed for symptomatic relief Follow up with PCP if symptoms persists Return or go to the ER if you have any new or worsening symptoms such as fever, chills, nausea, vomiting, chest pain, shortness of breath, cough, vision changes, worsening headache despite treatment, slurred speech, facial asymmetry, weakness in arms or legs, etc..Marland Kitchen

## 2021-05-22 ENCOUNTER — Encounter: Payer: Self-pay | Admitting: Emergency Medicine

## 2021-05-22 ENCOUNTER — Ambulatory Visit
Admission: EM | Admit: 2021-05-22 | Discharge: 2021-05-22 | Disposition: A | Discharge status: Home / Self Care | Payer: BLUE CROSS/BLUE SHIELD | Attending: Family Medicine | Admitting: Family Medicine

## 2021-05-22 DIAGNOSIS — J069 Acute upper respiratory infection, unspecified: Secondary | ICD-10-CM

## 2021-05-22 DIAGNOSIS — R112 Nausea with vomiting, unspecified: Secondary | ICD-10-CM

## 2021-05-22 DIAGNOSIS — R52 Pain, unspecified: Secondary | ICD-10-CM

## 2021-05-22 MED ORDER — ONDANSETRON HCL 4 MG PO TABS: 4.0000 mg | ORAL_TABLET | Freq: Four times a day (QID) | ORAL | 0 refills | Status: DC

## 2021-05-22 NOTE — Discharge Instructions (Signed)
I have sent in Zofran for you to take one tablet every 8 hours as needed for nausea.  Your COVID and Influenza tests are pending.  You should self quarantine until the test results are back.    Take Tylenol or ibuprofen as needed for fever or discomfort.  Rest and keep yourself hydrated.    Follow-up with your primary care provider if your symptoms are not improving.

## 2021-05-22 NOTE — ED Triage Notes (Addendum)
Woke up with a headache this morning. Started to have body aches, vomited x 1 today. Exposed to covid at work x 1 week ago

## 2021-05-22 NOTE — ED Provider Notes (Signed)
[facility]   [id number] 05/22/21 Arrival Time: 0912   CC: COVID symptoms  SUBJECTIVE: History from: patient.  Tammy Webster is a 60 y.o. female who presents with body aches, headache, vomiting x 1 today. Reports positive Covid exposure at work x 1 week ago. Denies recent travel. Has positive  history of Covid. Has completed Covid vaccines, no booster. Has not completed flu vaccine. Has not taken OTC medications for this. There are no aggravating or alleviating factors. Denies previous symptoms in the past. Denies fever, sinus pain, rhinorrhea, sore throat, SOB, wheezing, chest pain, nausea, changes in bowel or bladder habits.    ROS: As per HPI.  All other pertinent ROS negative.     Past Medical History:  Diagnosis Date  . Anxiety   . Arthritis   . Chronic back pain   . Chronic constipation   . Elevated BP without diagnosis of hypertension   . Gastric ulcer   . Headache(784.0)   . Helicobacter pylori gastritis    Treated 2013  . History of anemia   . History of bronchitis   . History of pneumonia   . Hyperlipidemia   . Insomnia   . Morbid obesity (HCC)   . MRSA infection    2007-2009   Past Surgical History:  Procedure Laterality Date  . CARPAL TUNNEL RELEASE Bilateral   . COLONOSCOPY  04/13/12   Rourk-anal canal hemorrhoids/normal colon,rectum  . ENDOSCOPIC CONCHA BULLOSA RESECTION Bilateral 09/08/2017   Procedure: ENDOSCOPIC CONCHA BULLOSA RESECTION;  Surgeon: Newman Pies, MD;  Location: [facility];  Service: ENT;  Laterality: Bilateral;  . ESOPHAGOGASTRODUODENOSCOPY  04/13/12   Rourk-normal esophaus s/p dilation/h pylori gastritis  . GALLBLADDER SURGERY  2004  . MALONEY DILATION  04/13/2012   43F  . NASAL SEPTOPLASTY W/ TURBINOPLASTY Bilateral 09/08/2017   Procedure: NASAL SEPTOPLASTY WITH TURBINATE REDUCTION;  Surgeon: Newman Pies, MD;  Location: [facility];  Service: ENT;  Laterality: Bilateral;  . SAVORY DILATION  04/13/2012  .  TUBAL LIGATION     No Known Allergies No current facility-administered medications on file prior to encounter.   Current Outpatient Medications on File Prior to Encounter  Medication Sig Dispense Refill  . doxycycline (VIBRAMYCIN) 100 MG capsule Take 1 capsule (100 mg total) by mouth 2 (two) times daily. 20 capsule 0  . [DISCONTINUED] cetirizine (ZYRTEC) 10 MG tablet Take 1 tablet (10 mg total) by mouth daily. 30 tablet 0  . [DISCONTINUED] fluticasone (FLONASE) 50 MCG/ACT nasal spray Place 2 sprays into both nostrils daily. 16 g 0   Social History   Socioeconomic History  . Marital status: Married    Spouse name: Not on file  . Number of children: 2  . Years of education: Not on file  . Highest education level: Not on file  Occupational History  . Not on file  Tobacco Use  . Smoking status: Former Smoker    Packs/day: 2.00    Years: 30.00    Pack years: 60.00    Types: Cigarettes  . Smokeless tobacco: Never Used  Substance and Sexual Activity  . Alcohol use: No  . Drug use: No  . Sexual activity: Not Currently    Birth control/protection: None  Other Topics Concern  . Not on file  Social History Narrative  . Not on file   Social Determinants of Health   Financial Resource Strain: Not on file  Food Insecurity: Not on file  Transportation Needs: Not on file  Physical  Activity: Not on file  Stress: Not on file  Social Connections: Not on file  Intimate Partner Violence: Not on file   Family History  Problem Relation Age of Onset  . Colon polyps Father        heart disease - stenting, "shocked heart twice"  . Gastric cancer Father        deceased Apr 28, 2012  . Heart disease Maternal Grandmother 45  . Breast cancer Maternal Grandmother 8  . Heart disease Maternal Grandfather 60       also MI  . Heart disease Paternal Grandmother 3  . Diabetes Paternal Grandmother   . Heart disease Paternal Grandfather 48  . Heart failure Paternal Grandfather        enlarged heart   . Liver disease Neg Hx   . Colon cancer Neg Hx     OBJECTIVE:  Vitals:   05/22/21 1022  BP: 129/76  Pulse: 88  Resp: 19  Temp: 99.6 F (37.6 C)  TempSrc: Oral  SpO2: 96%     General appearance: alert; appears fatigued, but nontoxic; speaking in full sentences and tolerating own secretions HEENT: NCAT; Ears: EACs clear, TMs pearly gray; Eyes: PERRL.  EOM grossly intact. Sinuses: nontender; Nose: nares patent with clear rhinorrhea, Throat: oropharynx erythematous, cobblestoning present, tonsils non erythematous or enlarged, uvula midline  Neck: supple without LAD Lungs: unlabored respirations, symmetrical air entry; cough: absent; no respiratory distress; CTAB Heart: regular rate and rhythm.  Radial pulses 2+ symmetrical bilaterally Skin: warm and dry Psychological: alert and cooperative; normal mood and affect  LABS:  No results found for this or any previous visit (from the past 24 hour(s)).   ASSESSMENT & PLAN:  1. Viral URI   2. Generalized body aches   3. Nausea and vomiting, intractability of vomiting not specified, unspecified vomiting type     Meds ordered this encounter  Medications  . ondansetron (ZOFRAN) 4 MG tablet    Sig: Take 1 tablet (4 mg total) by mouth every 6 (six) hours.    Dispense:  12 tablet    Refill:  0    Order Specific Question:   Supervising Provider    Answer:   Merrilee Jansky [id number]   Zofran prescribed prn nausea Continue supportive care at home COVID and flu testing ordered.  It will take between 2-3 days for test results. Someone will contact you regarding abnormal results.   Work note provided Patient should remain in quarantine until they have received Covid results.  If negative you may resume normal activities (go back to work/school) while practicing hand hygiene, social distance, and mask wearing.  If positive, patient should remain in quarantine for at least 5 days from symptom onset AND greater than 72 hours after symptoms  resolution (absence of fever without the use of fever-reducing medication and improvement in respiratory symptoms), whichever is longer Get plenty of rest and push fluids Use OTC zyrtec for nasal congestion, runny nose, and/or sore throat Use OTC flonase for nasal congestion and runny nose Use medications daily for symptom relief Use OTC medications like ibuprofen or tylenol as needed fever or pain Call or go to the ED if you have any new or worsening symptoms such as fever, worsening cough, shortness of breath, chest tightness, chest pain, turning blue, changes in mental status.  Reviewed expectations re: course of current medical issues. Questions answered. Outlined signs and symptoms indicating need for more acute intervention. Patient verbalized understanding. After Visit Summary given.  Moshe Cipro, NP 05/22/21 1041

## 2021-05-23 LAB — COVID-19, FLU A+B NAA
Influenza A, NAA: NOT DETECTED
Influenza B, NAA: NOT DETECTED
SARS-CoV-2, NAA: NOT DETECTED

## 2021-10-22 ENCOUNTER — Other Ambulatory Visit: Payer: Self-pay

## 2021-10-22 ENCOUNTER — Ambulatory Visit
Admission: EM | Admit: 2021-10-22 | Discharge: 2021-10-22 | Disposition: A | Discharge status: Home / Self Care | Payer: BLUE CROSS/BLUE SHIELD | Attending: Family Medicine | Admitting: Family Medicine

## 2021-10-22 DIAGNOSIS — R350 Frequency of micturition: Secondary | ICD-10-CM

## 2021-10-22 DIAGNOSIS — G8929 Other chronic pain: Secondary | ICD-10-CM

## 2021-10-22 DIAGNOSIS — M25561 Pain in right knee: Secondary | ICD-10-CM | POA: Diagnosis present

## 2021-10-22 LAB — POCT URINALYSIS DIP (MANUAL ENTRY)
Bilirubin, UA: NEGATIVE
Glucose, UA: NEGATIVE mg/dL
Ketones, POC UA: NEGATIVE mg/dL
Nitrite, UA: NEGATIVE
Protein Ur, POC: NEGATIVE mg/dL
Spec Grav, UA: 1.025 (ref 1.010–1.025)
Urobilinogen, UA: 0.2 E.U./dL
pH, UA: 5.5 (ref 5.0–8.0)

## 2021-10-22 MED ORDER — CEPHALEXIN 500 MG PO CAPS: 500.0000 mg | ORAL_CAPSULE | Freq: Two times a day (BID) | ORAL | 0 refills | Status: DC

## 2021-10-22 MED ORDER — DICLOFENAC SODIUM 1 % EX GEL: 2.0000 g | Freq: Four times a day (QID) | CUTANEOUS | 1 refills | Status: DC

## 2021-10-22 MED ORDER — PREDNISONE 20 MG PO TABS: 40.0000 mg | ORAL_TABLET | Freq: Every day | ORAL | 0 refills | Status: DC

## 2021-10-22 NOTE — ED Provider Notes (Signed)
[facility]    CSN: [id number] Arrival date & time: 10/22/21  0844      History   Chief Complaint Chief Complaint  Patient presents with   Dysuria   Knee Pain    HPI Tammy Webster is a 60 y.o. female.   Presenting today with 3-day history of minimal dysuria, moderate urinary urgency, frequency, hesitancy.  She denies nausea, vomiting, flank pain, pelvic or abdominal pain, fever, chills.  History of urinary tract infections that feels similar.  Not trying anything over-the-counter for symptoms other than drinking lots of water.  She is also having about a month of ongoing right knee pain without obvious injury.  She has arthritis in multiple other areas of her body and wonders if this is what is going on with the knee.  Denies discoloration, numbness, tingling, weakness, decreased range of motion but does have stiffness and crepitus with movement.  Takes meloxicam daily for her other areas of arthritis and states that the past few days it has not been helping with the knee.  Has also been trying icy hot, ice, heat with minimal relief.   Past Medical History:  Diagnosis Date   Anxiety    Arthritis    Chronic back pain    Chronic constipation    Elevated BP without diagnosis of hypertension    Gastric ulcer    Headache(784.0)    Helicobacter pylori gastritis    Treated 2013   History of anemia    History of bronchitis    History of pneumonia    Hyperlipidemia    Insomnia    Morbid obesity (HCC)    MRSA infection    2007-2009    Patient Active Problem List   Diagnosis Date Noted   Spinal stenosis, lumbar region, with neurogenic claudication 02/28/2014   Dyslipidemia 02/11/2014   Cardiomegaly 01/26/2014   DOE (dyspnea on exertion) 01/26/2014   Preoperative cardiovascular examination 01/26/2014   Right knee pain 11/03/2012   Low back pain 11/03/2012   Helicobacter pylori gastritis 06/29/2012   Hemorrhoids 06/29/2012   Epigastric pain 04/02/2012   GERD  (gastroesophageal reflux disease) 04/02/2012   Constipation, chronic 04/02/2012   Esophageal dysphagia 04/02/2012   Rectal bleeding 04/02/2012    Past Surgical History:  Procedure Laterality Date   CARPAL TUNNEL RELEASE Bilateral    COLONOSCOPY  04/13/12   Rourk-anal canal hemorrhoids/normal colon,rectum   ENDOSCOPIC CONCHA BULLOSA RESECTION Bilateral 09/08/2017   Procedure: ENDOSCOPIC CONCHA BULLOSA RESECTION;  Surgeon: Newman Pies, MD;  Location: [facility];  Service: ENT;  Laterality: Bilateral;   ESOPHAGOGASTRODUODENOSCOPY  04/13/12   Rourk-normal esophaus s/p dilation/h pylori gastritis   GALLBLADDER SURGERY  2004   MALONEY DILATION  04/13/2012   23F   NASAL SEPTOPLASTY W/ TURBINOPLASTY Bilateral 09/08/2017   Procedure: NASAL SEPTOPLASTY WITH TURBINATE REDUCTION;  Surgeon: Newman Pies, MD;  Location: [facility];  Service: ENT;  Laterality: Bilateral;   SAVORY DILATION  04/13/2012   TUBAL LIGATION      OB History   No obstetric history on file.      Home Medications    Prior to Admission medications   Medication Sig Start Date End Date Taking? Authorizing Provider  cephALEXin (KEFLEX) 500 MG capsule Take 1 capsule (500 mg total) by mouth 2 (two) times daily. 10/22/21  Yes Particia Nearing, PA-C  diclofenac Sodium (VOLTAREN) 1 % GEL Apply 2 g topically 4 (four) times daily. 10/22/21  Yes Particia Nearing, PA-C  predniSONE (DELTASONE) 20 MG tablet Take 2 tablets (40 mg total) by mouth daily with breakfast. 10/22/21  Yes Particia Nearing, PA-C  doxycycline (VIBRAMYCIN) 100 MG capsule Take 1 capsule (100 mg total) by mouth 2 (two) times daily. 05/03/21   Wurst, [place], PA-C  ondansetron (ZOFRAN) 4 MG tablet Take 1 tablet (4 mg total) by mouth every 6 (six) hours. 05/22/21   Moshe Cipro, NP  cetirizine (ZYRTEC) 10 MG tablet Take 1 tablet (10 mg total) by mouth daily. 01/29/20 05/03/21  Wurst, Lowanda Foster, PA-C  fluticasone (FLONASE) 50  MCG/ACT nasal spray Place 2 sprays into both nostrils daily. 01/29/20 05/03/21  Rennis Harding, PA-C    Family History Family History  Problem Relation Age of Onset   Colon polyps Father        heart disease - stenting, "shocked heart twice"   Gastric cancer Father        deceased Apr 23, 2012   Heart disease Maternal Grandmother 04-23-2044   Breast cancer Maternal Grandmother 65   Heart disease Maternal Grandfather 21       also MI   Heart disease Paternal Grandmother 26   Diabetes Paternal Grandmother    Heart disease Paternal Grandfather 46   Heart failure Paternal Grandfather        enlarged heart   Liver disease Neg Hx    Colon cancer Neg Hx     Social History Social History   Tobacco Use   Smoking status: Former    Packs/day: 2.00    Years: 30.00    Pack years: 60.00    Types: Cigarettes   Smokeless tobacco: Never  Substance Use Topics   Alcohol use: No   Drug use: No     Allergies   Patient has no known allergies.   Review of Systems Review of Systems Per HPI  Physical Exam Triage Vital Signs ED Triage Vitals  Enc Vitals Group     BP 10/22/21 0927 (!) 140/91     Pulse Rate 10/22/21 0927 88     Resp 10/22/21 0927 20     Temp 10/22/21 0927 98.4 F (36.9 C)     Temp src --      SpO2 10/22/21 0927 95 %     Weight --      Height --      Head Circumference --      Peak Flow --      Pain Score 10/22/21 0925 4     Pain Loc --      Pain Edu? --      Excl. in GC? --    No data found.  Updated Vital Signs BP (!) 140/91   Pulse 88   Temp 98.4 F (36.9 C)   Resp 20   SpO2 95%   Visual Acuity Right Eye Distance:   Left Eye Distance:   Bilateral Distance:    Right Eye Near:   Left Eye Near:    Bilateral Near:     Physical Exam Vitals and nursing note reviewed.  Constitutional:      Appearance: Normal appearance. She is not ill-appearing.  HENT:     Head: Atraumatic.     Mouth/Throat:     Mouth: Mucous membranes are moist.  Eyes:     Extraocular  Movements: Extraocular movements intact.     Conjunctiva/sclera: Conjunctivae normal.  Cardiovascular:     Rate and Rhythm: Normal rate and regular rhythm.     Heart sounds: Normal heart sounds.  Pulmonary:  Effort: Pulmonary effort is normal.     Breath sounds: Normal breath sounds.  Abdominal:     General: There is no distension.     Palpations: Abdomen is soft.     Tenderness: There is no abdominal tenderness. There is no right CVA tenderness, left CVA tenderness or guarding.  Musculoskeletal:        General: Tenderness present. No swelling, deformity or signs of injury. Normal range of motion.     Cervical back: Normal range of motion and neck supple.     Comments: Crepitus with passive range of motion of right knee.  Bilateral joint lines tender to palpation.  No obvious edema present in the right knee today.  Skin:    General: Skin is warm and dry.     Findings: No bruising or erythema.  Neurological:     Mental Status: She is alert and oriented to person, place, and time.     Comments: Right lower extremity neurovascularly intact  Psychiatric:        Mood and Affect: Mood normal.        Thought Content: Thought content normal.        Judgment: Judgment normal.     UC Treatments / Results  Labs (all labs ordered are listed, but only abnormal results are displayed) Labs Reviewed  POCT URINALYSIS DIP (MANUAL ENTRY) - Abnormal; Notable for the following components:      Result Value   Clarity, UA cloudy (*)    Blood, UA moderate (*)    Leukocytes, UA Small (1+) (*)    All other components within normal limits  URINE CULTURE    EKG   Radiology No results found.  Procedures Procedures (including critical care time)  Medications Ordered in UC Medications - No data to display  Initial Impression / Assessment and Plan / UC Course  I have reviewed the triage vital signs and the nursing notes.  Pertinent labs & imaging results that were available during my care  of the patient were reviewed by me and considered in my medical decision making (see chart for details).     Vitals and exam overall reassuring today, UA showing evidence of a urinary tract infection.  Urine culture pending, treat with Keflex in the meantime particularly given her history of urinary tract infections.  We will adjust if needed based on those results.  Do suspect arthritis regarding her chronic right knee pain.  We will treat with a short burst of prednisone as meloxicam alone is not relieving the flare.  Voltaren gel sent additionally for supportive pain relief.  Supportive home care and Ortho follow-up recommended.  Final Clinical Impressions(s) / UC Diagnoses   Final diagnoses:  Urinary frequency  Chronic pain of right knee   Discharge Instructions   None    ED Prescriptions     Medication Sig Dispense Auth. Provider   cephALEXin (KEFLEX) 500 MG capsule Take 1 capsule (500 mg total) by mouth 2 (two) times daily. 10 capsule Particia Nearing, [place]   diclofenac Sodium (VOLTAREN) 1 % GEL Apply 2 g topically 4 (four) times daily. 150 g Particia Nearing, [place]   predniSONE (DELTASONE) 20 MG tablet Take 2 tablets (40 mg total) by mouth daily with breakfast. 10 tablet Particia Nearing, [place]      PDMP not reviewed this encounter.   Particia Nearing, [place] 10/22/21 1009

## 2021-10-22 NOTE — ED Triage Notes (Signed)
Pt presents with c/o dysuria for a couple days and knee pain for past month, no injury

## 2021-10-24 LAB — URINE CULTURE: Culture: >=100000 — AB

## 2021-12-04 ENCOUNTER — Other Ambulatory Visit (HOSPITAL_COMMUNITY): Payer: Self-pay | Admitting: Internal Medicine

## 2021-12-04 ENCOUNTER — Ambulatory Visit (HOSPITAL_COMMUNITY)
Admission: RE | Admit: 2021-12-04 | Discharge: 2021-12-04 | Disposition: A | Discharge status: Home / Self Care | Payer: BLUE CROSS/BLUE SHIELD | Source: Ambulatory Visit | Attending: Internal Medicine | Admitting: Internal Medicine

## 2021-12-04 ENCOUNTER — Other Ambulatory Visit: Payer: Self-pay

## 2021-12-04 DIAGNOSIS — M1711 Unilateral primary osteoarthritis, right knee: Secondary | ICD-10-CM

## 2021-12-04 DIAGNOSIS — Z1231 Encounter for screening mammogram for malignant neoplasm of breast: Secondary | ICD-10-CM

## 2021-12-04 DIAGNOSIS — M25561 Pain in right knee: Secondary | ICD-10-CM | POA: Insufficient documentation

## 2021-12-09 ENCOUNTER — Ambulatory Visit
Admission: EM | Admit: 2021-12-09 | Discharge: 2021-12-09 | Disposition: A | Discharge status: Home Health | Payer: BLUE CROSS/BLUE SHIELD

## 2021-12-09 ENCOUNTER — Other Ambulatory Visit: Payer: Self-pay

## 2021-12-09 ENCOUNTER — Encounter: Payer: Self-pay | Admitting: *Deleted

## 2021-12-09 DIAGNOSIS — U071 COVID-19: Secondary | ICD-10-CM | POA: Diagnosis not present

## 2021-12-09 HISTORY — DX: COVID-19: U07.1

## 2021-12-09 MED ORDER — MOLNUPIRAVIR EUA 200MG CAPSULE: 4.0000 | ORAL_CAPSULE | Freq: Two times a day (BID) | ORAL | 0 refills | Status: AC

## 2021-12-09 MED ORDER — ALBUTEROL SULFATE HFA 108 (90 BASE) MCG/ACT IN AERS: 1.0000 | INHALATION_SPRAY | Freq: Four times a day (QID) | RESPIRATORY_TRACT | 0 refills | Status: DC | PRN

## 2021-12-09 MED ORDER — PROMETHAZINE-DM 6.25-15 MG/5ML PO SYRP: 5.0000 mL | ORAL_SOLUTION | Freq: Four times a day (QID) | ORAL | 0 refills | Status: DC | PRN

## 2021-12-09 NOTE — ED Triage Notes (Signed)
C/O cough, body aches, fever up to 101, HA onset 2 days ago.  Reports positive Covid home test this AM.

## 2021-12-09 NOTE — ED Provider Notes (Signed)
[facility]    CSN: [id number] Arrival date & time: 12/09/21  1018      History   Chief Complaint Chief Complaint  Patient presents with   Cough    +Covid home test    HPI Tammy Webster is a 60 y.o. female.   Patient presenting today with 2-day history of cough, body aches, fever, headache, malaise, fatigue.  Denies chest pain, shortness of breath, abdominal pain, nausea vomiting or diarrhea.  So far taking NyQuil, prednisone which she was given for her knee last week.  Positive home COVID test this morning.  She does have an albuterol inhaler at home but it is expired.  Sometimes has wheezing with illness.   Past Medical History:  Diagnosis Date   Anxiety    Arthritis    Chronic back pain    Chronic constipation    COVID-19    Elevated BP without diagnosis of hypertension    Gastric ulcer    Headache(784.0)    Helicobacter pylori gastritis    Treated 2013   History of anemia    History of bronchitis    History of pneumonia    Hyperlipidemia    Insomnia    Morbid obesity (HCC)    MRSA infection    2007-2009    Patient Active Problem List   Diagnosis Date Noted   Spinal stenosis, lumbar region, with neurogenic claudication 02/28/2014   Dyslipidemia 02/11/2014   Cardiomegaly 01/26/2014   DOE (dyspnea on exertion) 01/26/2014   Preoperative cardiovascular examination 01/26/2014   Right knee pain 11/03/2012   Low back pain 11/03/2012   Helicobacter pylori gastritis 06/29/2012   Hemorrhoids 06/29/2012   Epigastric pain 04/02/2012   GERD (gastroesophageal reflux disease) 04/02/2012   Constipation, chronic 04/02/2012   Esophageal dysphagia 04/02/2012   Rectal bleeding 04/02/2012    Past Surgical History:  Procedure Laterality Date   CARPAL TUNNEL RELEASE Bilateral    COLONOSCOPY  04/13/2012   Rourk-anal canal hemorrhoids/normal colon,rectum   ENDOSCOPIC CONCHA BULLOSA RESECTION Bilateral 09/08/2017   Procedure: ENDOSCOPIC CONCHA BULLOSA  RESECTION;  Surgeon: Newman Pies, MD;  Location: [facility];  Service: ENT;  Laterality: Bilateral;   ESOPHAGOGASTRODUODENOSCOPY  04/13/2012   Rourk-normal esophaus s/p dilation/h pylori gastritis   GALLBLADDER SURGERY  12/23/2002   MALONEY DILATION  04/13/2012   65F   NASAL SEPTOPLASTY W/ TURBINOPLASTY Bilateral 09/08/2017   Procedure: NASAL SEPTOPLASTY WITH TURBINATE REDUCTION;  Surgeon: Newman Pies, MD;  Location: [facility];  Service: ENT;  Laterality: Bilateral;   SAVORY DILATION  04/13/2012   TUBAL LIGATION      OB History   No obstetric history on file.      Home Medications    Prior to Admission medications   Medication Sig Start Date End Date Taking? Authorizing Provider  albuterol (VENTOLIN HFA) 108 (90 Base) MCG/ACT inhaler Inhale 1-2 puffs into the lungs every 6 (six) hours as needed for wheezing or shortness of breath. 12/09/21  Yes Particia Nearing, PA-C  diclofenac Sodium (VOLTAREN) 1 % GEL Apply 2 g topically 4 (four) times daily. 10/22/21  Yes Particia Nearing, PA-C  molnupiravir EUA (LAGEVRIO) 200 mg CAPS capsule Take 4 capsules (800 mg total) by mouth 2 (two) times daily for 5 days. 12/09/21 12/14/21 Yes Particia Nearing, PA-C  PREDNISONE PO Take by mouth. States finishing up 6-day course for knee pain   Yes [provider]  promethazine-dextromethorphan (PROMETHAZINE-DM) 6.25-15 MG/5ML syrup Take 5 mLs by  mouth 4 (four) times daily as needed for cough. 12/09/21  Yes Particia Nearing, PA-C  cephALEXin (KEFLEX) 500 MG capsule Take 1 capsule (500 mg total) by mouth 2 (two) times daily. 10/22/21   Particia Nearing, PA-C  doxycycline (VIBRAMYCIN) 100 MG capsule Take 1 capsule (100 mg total) by mouth 2 (two) times daily. 05/03/21   Wurst, [place], PA-C  ondansetron (ZOFRAN) 4 MG tablet Take 1 tablet (4 mg total) by mouth every 6 (six) hours. 05/22/21   Moshe Cipro, NP  predniSONE (DELTASONE) 20 MG tablet  Take 2 tablets (40 mg total) by mouth daily with breakfast. 10/22/21   Particia Nearing, PA-C  cetirizine (ZYRTEC) 10 MG tablet Take 1 tablet (10 mg total) by mouth daily. 01/29/20 05/03/21  Wurst, Lowanda Foster, PA-C  fluticasone (FLONASE) 50 MCG/ACT nasal spray Place 2 sprays into both nostrils daily. 01/29/20 05/03/21  Rennis Harding, PA-C    Family History Family History  Problem Relation Age of Onset   Healthy Mother    Colon polyps Father        heart disease - stenting, "shocked heart twice"   Gastric cancer Father        deceased 30-Apr-2012   Heart disease Maternal Grandmother 2044-04-30   Breast cancer Maternal Grandmother 61   Heart disease Maternal Grandfather 62       also MI   Heart disease Paternal Grandmother 12   Diabetes Paternal Grandmother    Heart disease Paternal Grandfather 93   Heart failure Paternal Grandfather        enlarged heart   Liver disease Neg Hx    Colon cancer Neg Hx     Social History Social History   Tobacco Use   Smoking status: Former    Packs/day: 2.00    Years: 30.00    Pack years: 60.00    Types: Cigarettes   Smokeless tobacco: Never  Vaping Use   Vaping Use: Never used  Substance Use Topics   Alcohol use: No   Drug use: No     Allergies   Patient has no known allergies.   Review of Systems Review of Systems Per HPI  Physical Exam Triage Vital Signs ED Triage Vitals  Enc Vitals Group     BP 12/09/21 1236 (!) 149/92     Pulse Rate 12/09/21 1236 70     Resp 12/09/21 1236 18     Temp 12/09/21 1236 97.6 F (36.4 C)     Temp Source 12/09/21 1236 Temporal     SpO2 12/09/21 1236 97 %     Weight --      Height --      Head Circumference --      Peak Flow --      Pain Score 12/09/21 1237 6     Pain Loc --      Pain Edu? --      Excl. in GC? --    No data found.  Updated Vital Signs BP (!) 149/92    Pulse 70    Temp 97.6 F (36.4 C) (Temporal)    Resp 18    SpO2 97%   Visual Acuity Right Eye Distance:   Left Eye Distance:    Bilateral Distance:    Right Eye Near:   Left Eye Near:    Bilateral Near:     Physical Exam Vitals and nursing note reviewed.  Constitutional:      Appearance: Normal appearance.  HENT:     Head:  Atraumatic.     Right Ear: Tympanic membrane and external ear normal.     Left Ear: Tympanic membrane and external ear normal.     Nose: Rhinorrhea present.     Mouth/Throat:     Mouth: Mucous membranes are moist.     Pharynx: Posterior oropharyngeal erythema present.  Eyes:     Extraocular Movements: Extraocular movements intact.     Conjunctiva/sclera: Conjunctivae normal.  Cardiovascular:     Rate and Rhythm: Normal rate and regular rhythm.     Heart sounds: Normal heart sounds.  Pulmonary:     Effort: Pulmonary effort is normal.     Breath sounds: Wheezing present. No rales.     Comments: Minimal scattered wheezes bilaterally Musculoskeletal:        General: Normal range of motion.     Cervical back: Normal range of motion and neck supple.  Skin:    General: Skin is warm and dry.  Neurological:     Mental Status: She is alert and oriented to person, place, and time.  Psychiatric:        Mood and Affect: Mood normal.        Thought Content: Thought content normal.   UC Treatments / Results  Labs (all labs ordered are listed, but only abnormal results are displayed) Labs Reviewed - No data to display  EKG   Radiology No results found.  Procedures Procedures (including critical care time)  Medications Ordered in UC Medications - No data to display  Initial Impression / Assessment and Plan / UC Course  I have reviewed the triage vital signs and the nursing notes.  Pertinent labs & imaging results that were available during my care of the patient were reviewed by me and considered in my medical decision making (see chart for details).     Positive home COVID test this morning, will treat with molnupiravir, albuterol inhaler and Phenergan DM.  Discussed  abortive over-the-counter medications and home care.  Work note given.  Return for acutely worsening symptoms.  Final Clinical Impressions(s) / UC Diagnoses   Final diagnoses:  COVID-19   Discharge Instructions   None    ED Prescriptions     Medication Sig Dispense Auth. Provider   molnupiravir EUA (LAGEVRIO) 200 mg CAPS capsule Take 4 capsules (800 mg total) by mouth 2 (two) times daily for 5 days. 40 capsule Particia Nearing, [place]   albuterol (VENTOLIN HFA) 108 (90 Base) MCG/ACT inhaler Inhale 1-2 puffs into the lungs every 6 (six) hours as needed for wheezing or shortness of breath. 18 g Roosvelt Maser [place], [place]   promethazine-dextromethorphan (PROMETHAZINE-DM) 6.25-15 MG/5ML syrup Take 5 mLs by mouth 4 (four) times daily as needed for cough. 118 mL Particia Nearing, [place]      PDMP not reviewed this encounter.   Particia Nearing, [place] 12/09/21 1310

## 2021-12-23 HISTORY — PX: TOTAL KNEE ARTHROPLASTY: SHX125

## 2021-12-26 ENCOUNTER — Ambulatory Visit (HOSPITAL_COMMUNITY): Payer: BLUE CROSS/BLUE SHIELD

## 2021-12-31 ENCOUNTER — Other Ambulatory Visit: Payer: Self-pay | Admitting: Family Medicine

## 2021-12-31 ENCOUNTER — Ambulatory Visit (HOSPITAL_COMMUNITY)
Admission: RE | Admit: 2021-12-31 | Discharge: 2021-12-31 | Disposition: A | Discharge status: Home / Self Care | Payer: BLUE CROSS/BLUE SHIELD | Source: Ambulatory Visit | Attending: Internal Medicine | Admitting: Internal Medicine

## 2021-12-31 ENCOUNTER — Other Ambulatory Visit: Payer: Self-pay

## 2021-12-31 DIAGNOSIS — Z1231 Encounter for screening mammogram for malignant neoplasm of breast: Secondary | ICD-10-CM | POA: Insufficient documentation

## 2022-08-18 ENCOUNTER — Other Ambulatory Visit: Payer: Self-pay

## 2022-08-18 ENCOUNTER — Ambulatory Visit
Admission: EM | Admit: 2022-08-18 | Discharge: 2022-08-18 | Disposition: A | Discharge status: Home / Self Care | Payer: BLUE CROSS/BLUE SHIELD | Attending: Family Medicine | Admitting: Family Medicine

## 2022-08-18 ENCOUNTER — Encounter: Payer: Self-pay | Admitting: Emergency Medicine

## 2022-08-18 DIAGNOSIS — J069 Acute upper respiratory infection, unspecified: Secondary | ICD-10-CM | POA: Diagnosis not present

## 2022-08-18 MED ORDER — PROMETHAZINE-DM 6.25-15 MG/5ML PO SYRP: 5.0000 mL | ORAL_SOLUTION | Freq: Four times a day (QID) | ORAL | 0 refills | Status: DC | PRN

## 2022-08-18 MED ORDER — AMOXICILLIN-POT CLAVULANATE 875-125 MG PO TABS: 1.0000 | ORAL_TABLET | Freq: Two times a day (BID) | ORAL | 0 refills | Status: DC

## 2022-08-18 NOTE — ED Provider Notes (Signed)
[facility]    CSN: [id number] Arrival date & time: 08/18/22  0801      History   Chief Complaint Chief Complaint  Patient presents with   Cough    HPI Tammy Webster is a 61 y.o. female.   Patient presenting today with over a week of hacking productive cough, congestion, facial pain and pressure, sore throat, fatigue.  States she initially had some fevers and body aches but those have resolved.  Denies chest pain, shortness of breath, abdominal pain, nausea vomiting or diarrhea.  Has been trying numerous over-the-counter cold and congestion medications with no relief.  No known history of chronic pulmonary disease per patient.  No known sick contacts recently.    Past Medical History:  Diagnosis Date   Anxiety    Arthritis    Chronic back pain    Chronic constipation    COVID-19    Elevated BP without diagnosis of hypertension    Gastric ulcer    Headache(784.0)    Helicobacter pylori gastritis    Treated 2013   History of anemia    History of bronchitis    History of pneumonia    Hyperlipidemia    Insomnia    Morbid obesity (HCC)    MRSA infection    2007-2009    Patient Active Problem List   Diagnosis Date Noted   Spinal stenosis, lumbar region, with neurogenic claudication 02/28/2014   Dyslipidemia 02/11/2014   Cardiomegaly 01/26/2014   DOE (dyspnea on exertion) 01/26/2014   Preoperative cardiovascular examination 01/26/2014   Right knee pain 11/03/2012   Low back pain 11/03/2012   Helicobacter pylori gastritis 06/29/2012   Hemorrhoids 06/29/2012   Epigastric pain 04/02/2012   GERD (gastroesophageal reflux disease) 04/02/2012   Constipation, chronic 04/02/2012   Esophageal dysphagia 04/02/2012   Rectal bleeding 04/02/2012    Past Surgical History:  Procedure Laterality Date   CARPAL TUNNEL RELEASE Bilateral    COLONOSCOPY  04/13/2012   Rourk-anal canal hemorrhoids/normal colon,rectum   ENDOSCOPIC CONCHA BULLOSA RESECTION Bilateral  09/08/2017   Procedure: ENDOSCOPIC CONCHA BULLOSA RESECTION;  Surgeon: Newman Pies, MD;  Location: [facility];  Service: ENT;  Laterality: Bilateral;   ESOPHAGOGASTRODUODENOSCOPY  04/13/2012   Rourk-normal esophaus s/p dilation/h pylori gastritis   GALLBLADDER SURGERY  12/23/2002   MALONEY DILATION  04/13/2012   48F   NASAL SEPTOPLASTY W/ TURBINOPLASTY Bilateral 09/08/2017   Procedure: NASAL SEPTOPLASTY WITH TURBINATE REDUCTION;  Surgeon: Newman Pies, MD;  Location: [facility];  Service: ENT;  Laterality: Bilateral;   SAVORY DILATION  04/13/2012   TUBAL LIGATION      OB History   No obstetric history on file.      Home Medications    Prior to Admission medications   Medication Sig Start Date End Date Taking? Authorizing Provider  amoxicillin-clavulanate (AUGMENTIN) 875-125 MG tablet Take 1 tablet by mouth every 12 (twelve) hours. 08/18/22  Yes Particia Nearing, PA-C  promethazine-dextromethorphan (PROMETHAZINE-DM) 6.25-15 MG/5ML syrup Take 5 mLs by mouth 4 (four) times daily as needed. 08/18/22  Yes Particia Nearing, PA-C  albuterol (VENTOLIN HFA) 108 (90 Base) MCG/ACT inhaler Inhale 1-2 puffs into the lungs every 6 (six) hours as needed for wheezing or shortness of breath. 12/09/21   Particia Nearing, PA-C  cephALEXin (KEFLEX) 500 MG capsule Take 1 capsule (500 mg total) by mouth 2 (two) times daily. 10/22/21   Particia Nearing, PA-C  diclofenac Sodium (VOLTAREN) 1 % GEL Apply 2  g topically 4 (four) times daily. 10/22/21   Particia Nearing, PA-C  doxycycline (VIBRAMYCIN) 100 MG capsule Take 1 capsule (100 mg total) by mouth 2 (two) times daily. 05/03/21   Wurst, [place], PA-C  ondansetron (ZOFRAN) 4 MG tablet Take 1 tablet (4 mg total) by mouth every 6 (six) hours. 05/22/21   Moshe Cipro, NP  predniSONE (DELTASONE) 20 MG tablet Take 2 tablets (40 mg total) by mouth daily with breakfast. 10/22/21   Particia Nearing, PA-C   PREDNISONE PO Take by mouth. States finishing up 6-day course for knee pain    [provider]  promethazine-dextromethorphan (PROMETHAZINE-DM) 6.25-15 MG/5ML syrup Take 5 mLs by mouth 4 (four) times daily as needed for cough. 12/09/21   Particia Nearing, PA-C  cetirizine (ZYRTEC) 10 MG tablet Take 1 tablet (10 mg total) by mouth daily. 01/29/20 05/03/21  Wurst, Lowanda Foster, PA-C  fluticasone (FLONASE) 50 MCG/ACT nasal spray Place 2 sprays into both nostrils daily. 01/29/20 05/03/21  Rennis Harding, PA-C    Family History Family History  Problem Relation Age of Onset   Healthy Mother    Colon polyps Father        heart disease - stenting, "shocked heart twice"   Gastric cancer Father        deceased Apr 22, 2012   Heart disease Maternal Grandmother Apr 22, 2044   Breast cancer Maternal Grandmother 39   Heart disease Maternal Grandfather 77       also MI   Heart disease Paternal Grandmother 31   Diabetes Paternal Grandmother    Heart disease Paternal Grandfather 35   Heart failure Paternal Grandfather        enlarged heart   Liver disease Neg Hx    Colon cancer Neg Hx     Social History Social History   Tobacco Use   Smoking status: Former    Packs/day: 2.00    Years: 30.00    Total pack years: 60.00    Types: Cigarettes   Smokeless tobacco: Never  Vaping Use   Vaping Use: Never used  Substance Use Topics   Alcohol use: No   Drug use: No     Allergies   Patient has no known allergies.   Review of Systems Review of Systems PER HPI  Physical Exam Triage Vital Signs ED Triage Vitals [08/18/22 0812]  Enc Vitals Group     BP (!) 142/99     Pulse Rate 71     Resp 20     Temp 98.1 F (36.7 C)     Temp Source Oral     SpO2 98 %     Weight      Height      Head Circumference      Peak Flow      Pain Score 3     Pain Loc      Pain Edu?      Excl. in GC?    No data found.  Updated Vital Signs BP (!) 142/99 (BP Location: Right Arm)   Pulse 71   Temp 98.1 F  (36.7 C) (Oral)   Resp 20   SpO2 98%   Visual Acuity Right Eye Distance:   Left Eye Distance:   Bilateral Distance:    Right Eye Near:   Left Eye Near:    Bilateral Near:     Physical Exam Vitals and nursing note reviewed.  Constitutional:      Appearance: Normal appearance.  HENT:     Head:  Atraumatic.     Right Ear: Tympanic membrane and external ear normal.     Left Ear: Tympanic membrane and external ear normal.     Nose: Congestion present.     Mouth/Throat:     Mouth: Mucous membranes are moist.     Pharynx: Posterior oropharyngeal erythema present.  Eyes:     Extraocular Movements: Extraocular movements intact.     Conjunctiva/sclera: Conjunctivae normal.  Cardiovascular:     Rate and Rhythm: Normal rate and regular rhythm.     Heart sounds: Normal heart sounds.  Pulmonary:     Effort: Pulmonary effort is normal.     Breath sounds: Normal breath sounds. No wheezing or rales.  Musculoskeletal:        General: Normal range of motion.     Cervical back: Normal range of motion and neck supple.  Skin:    General: Skin is warm and dry.  Neurological:     Mental Status: She is alert and oriented to person, place, and time.  Psychiatric:        Mood and Affect: Mood normal.        Thought Content: Thought content normal.      UC Treatments / Results  Labs (all labs ordered are listed, but only abnormal results are displayed) Labs Reviewed - No data to display  EKG   Radiology No results found.  Procedures Procedures (including critical care time)  Medications Ordered in UC Medications - No data to display  Initial Impression / Assessment and Plan / UC Course  I have reviewed the triage vital signs and the nursing notes.  Pertinent labs & imaging results that were available during my care of the patient were reviewed by me and considered in my medical decision making (see chart for details).     Given duration and worsening course, start  Augmentin, Phenergan DM in addition to supportive over-the-counter medications and home care.  We will forego viral testing given duration.  Return for any worsening symptoms.  Final Clinical Impressions(s) / UC Diagnoses   Final diagnoses:  Upper respiratory tract infection, unspecified type   Discharge Instructions   None    ED Prescriptions     Medication Sig Dispense Auth. Provider   amoxicillin-clavulanate (AUGMENTIN) 875-125 MG tablet Take 1 tablet by mouth every 12 (twelve) hours. 14 tablet Particia Nearing, [place]   promethazine-dextromethorphan (PROMETHAZINE-DM) 6.25-15 MG/5ML syrup Take 5 mLs by mouth 4 (four) times daily as needed. 100 mL Particia Nearing, [place]      PDMP not reviewed this encounter.   Roosvelt Maser [place], [place] 08/18/22 [phone]

## 2022-08-18 NOTE — ED Triage Notes (Signed)
Pt reports cough, nasal congestion x1 week. Pt reports has taken otc medication with no change in symptoms.

## 2022-08-29 ENCOUNTER — Encounter: Payer: Self-pay | Admitting: Obstetrics & Gynecology

## 2022-08-29 ENCOUNTER — Ambulatory Visit (INDEPENDENT_AMBULATORY_CARE_PROVIDER_SITE_OTHER): Payer: BLUE CROSS/BLUE SHIELD | Admitting: Obstetrics & Gynecology

## 2022-08-29 ENCOUNTER — Other Ambulatory Visit (HOSPITAL_COMMUNITY)
Admission: RE | Admit: 2022-08-29 | Discharge: 2022-08-29 | Disposition: A | Discharge status: Home / Self Care | Payer: BLUE CROSS/BLUE SHIELD | Source: Ambulatory Visit | Attending: Obstetrics & Gynecology | Admitting: Obstetrics & Gynecology

## 2022-08-29 VITALS — BP 127/86 | HR 82 | Ht 65.5 in | Wt 235.8 lb

## 2022-08-29 DIAGNOSIS — N941 Unspecified dyspareunia: Secondary | ICD-10-CM

## 2022-08-29 DIAGNOSIS — Z01411 Encounter for gynecological examination (general) (routine) with abnormal findings: Secondary | ICD-10-CM | POA: Insufficient documentation

## 2022-08-29 DIAGNOSIS — N952 Postmenopausal atrophic vaginitis: Secondary | ICD-10-CM

## 2022-08-29 MED ORDER — ESTROGENS CONJUGATED 0.625 MG/GM VA CREA: TOPICAL_CREAM | VAGINAL | 4 refills | Status: AC

## 2022-08-29 NOTE — Progress Notes (Signed)
   WELL-WOMAN EXAMINATION Patient name: Tammy Webster MRN [id number]  Date of birth: 06/12/1961 Chief Complaint:   Gynecologic Exam  History of Present Illness:   Tammy Webster is a 62 y.o. PM female being seen today for a routine well-woman exam.   Dyspareunia: Notes pain/discomfort with IC especially on initiation. Using OTC lubricants which helps to some extent.  On occasion, after sex will see some light pink spotting.  Denies spontaneous or BRB.  Denies pelvic or abdominal pain.  No other acute complaints   No LMP recorded. Patient is postmenopausal.  The current method of family planning is tubal ligation.   Last pap due to day.  Last mammogram: 12/2021. Last colonoscopy: 2013     08/29/2022   10:15 AM  Depression screen PHQ 2/9  Decreased Interest 0  Down, Depressed, Hopeless 0  PHQ - 2 Score 0      Review of Systems:   Pertinent items are noted in HPI Denies any headaches, blurred vision, fatigue, shortness of breath, chest pain, abdominal pain.  Denies problems with bowel movements, urination unless otherwise stated above.  Pertinent History Reviewed:  Reviewed past medical,surgical, social and family history.  Reviewed problem list, medications and allergies. Physical Assessment:   Vitals:   08/29/22 1008  BP: 127/86  Pulse: 82  Weight: 235 lb 12.8 oz (107 kg)  Height: 5' 5.5" (1.664 m)  Body mass index is 38.64 kg/m.        Physical Examination:   General appearance - well appearing, and in no distress  Mental status - alert, oriented to person, place, and time  Psych:  She has a normal mood and affect  Skin - warm and dry, normal color, no suspicious lesions noted  Chest - effort normal, all lung fields clear to auscultation bilaterally  Heart - normal rate and regular rhythm  Neck:  midline trachea, no thyromegaly or nodules  Breasts - breasts appear normal, no suspicious masses, no skin or nipple changes or  axillary nodes  Abdomen - obese, soft,  nontender, nondistended, no masses or organomegaly  Pelvic - VULVA: normal appearing vulva with no masses, tenderness or lesions  VAGINA: normal appearing vagina with normal color and discharge, no lesions  CERVIX: normal appearing cervix without discharge or lesions, no CMT  Thin prep pap is done with HR HPV cotesting  UTERUS: uterus is felt to be normal size, shape, consistency and nontender   ADNEXA: No adnexal masses or tenderness noted- exam limited due to body habitus  Extremities:  No swelling or varicosities noted  Chaperone:  pt declined      Assessment & Plan:  1) Well-Woman Exam -pap collected -mammogram up to date  2) Vaginal atrophy, Dyspareunia -Discussed vaginal estrogen treatment- reviewed WHI study and contraindications.  Questions/concerns addressed, pt desires to proceed -pt denies h/o breast or uterine cancer.  Denies h/o VTE, stroke or MI -f/u in 28mos  Meds ordered this encounter  Medications   conjugated estrogens (PREMARIN) vaginal cream    Sig: 0.5g twice per week    Dispense:  96.42 g    Refill:  4    Follow-up: Return in about 3 months (around 11/28/2022) for medicaiton follow up.   Myna Hidalgo, DO Attending Obstetrician & Gynecologist, [facility] for [employer], [facility]

## 2022-08-30 ENCOUNTER — Ambulatory Visit: Payer: BLUE CROSS/BLUE SHIELD | Admitting: Obstetrics & Gynecology

## 2022-09-03 LAB — CYTOLOGY - PAP
Comment: NEGATIVE
Diagnosis: NEGATIVE
High risk HPV: NEGATIVE

## 2022-10-10 ENCOUNTER — Ambulatory Visit (HOSPITAL_COMMUNITY)
Admission: RE | Admit: 2022-10-10 | Discharge: 2022-10-10 | Disposition: A | Discharge status: Home / Self Care | Payer: BLUE CROSS/BLUE SHIELD | Source: Ambulatory Visit | Attending: Orthopedic Surgery | Admitting: Orthopedic Surgery

## 2022-10-10 ENCOUNTER — Other Ambulatory Visit (HOSPITAL_COMMUNITY): Payer: Self-pay | Admitting: Orthopedic Surgery

## 2022-10-10 DIAGNOSIS — M79604 Pain in right leg: Secondary | ICD-10-CM | POA: Insufficient documentation

## 2022-10-10 DIAGNOSIS — M7989 Other specified soft tissue disorders: Secondary | ICD-10-CM | POA: Diagnosis not present

## 2022-10-18 ENCOUNTER — Telehealth (HOSPITAL_COMMUNITY): Payer: Self-pay | Admitting: Orthopedic Surgery

## 2022-10-18 MED ORDER — OXYCODONE HCL 5 MG PO TABS: 5.0000 mg | ORAL_TABLET | Freq: Four times a day (QID) | ORAL | 0 refills | Status: AC | PRN

## 2022-10-18 NOTE — Telephone Encounter (Signed)
Refill for oxy

## 2023-04-20 IMAGING — MG MM DIGITAL SCREENING BILAT W/ TOMO AND CAD
8 series · 8 of 24 positions shown · non-contrast
Comparison: Previous exam(s).

CLINICAL DATA: Screening.

EXAM:
DIGITAL SCREENING BILATERAL MAMMOGRAM WITH TOMOSYNTHESIS AND CAD
TECHNIQUE: Bilateral screening digital craniocaudal and mediolateral oblique
mammograms were obtained. Bilateral screening digital breast
tomosynthesis was performed. The images were evaluated with
computer-aided detection.

[R MLO synth-2D]
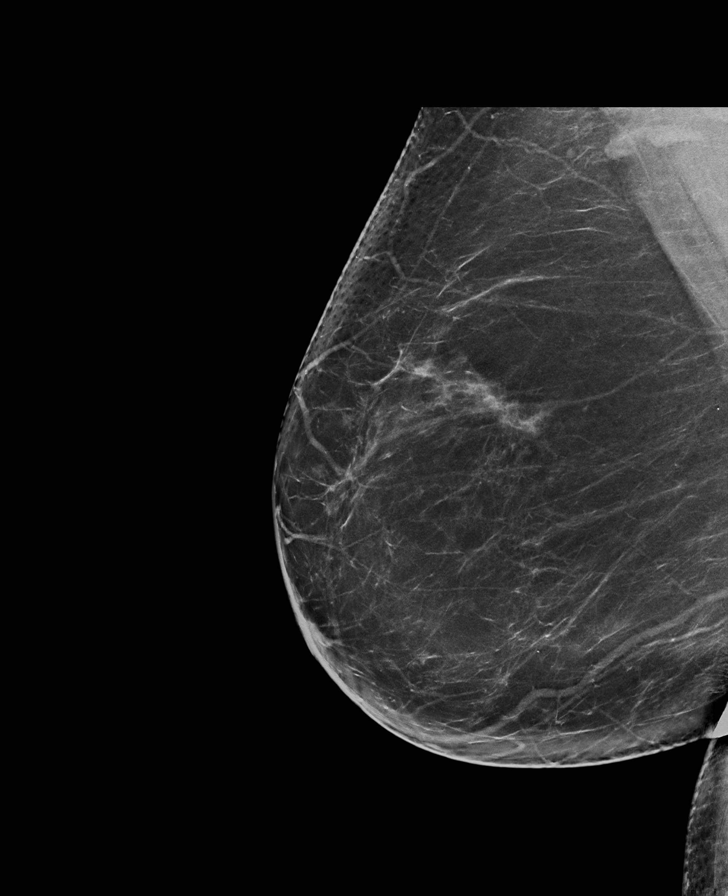

[R CC synth-2D]
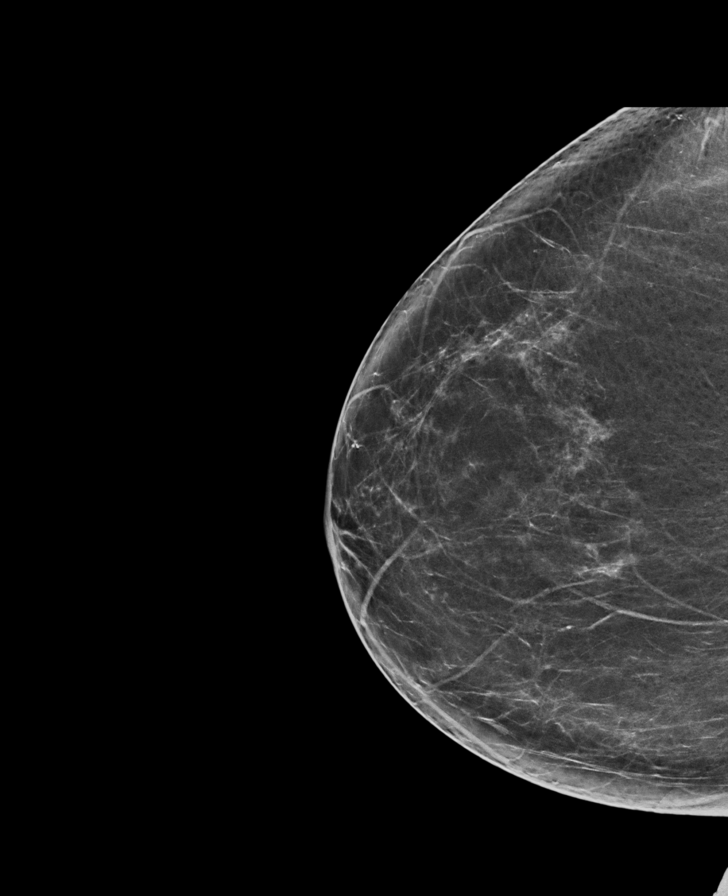

[L CC synth-2D]
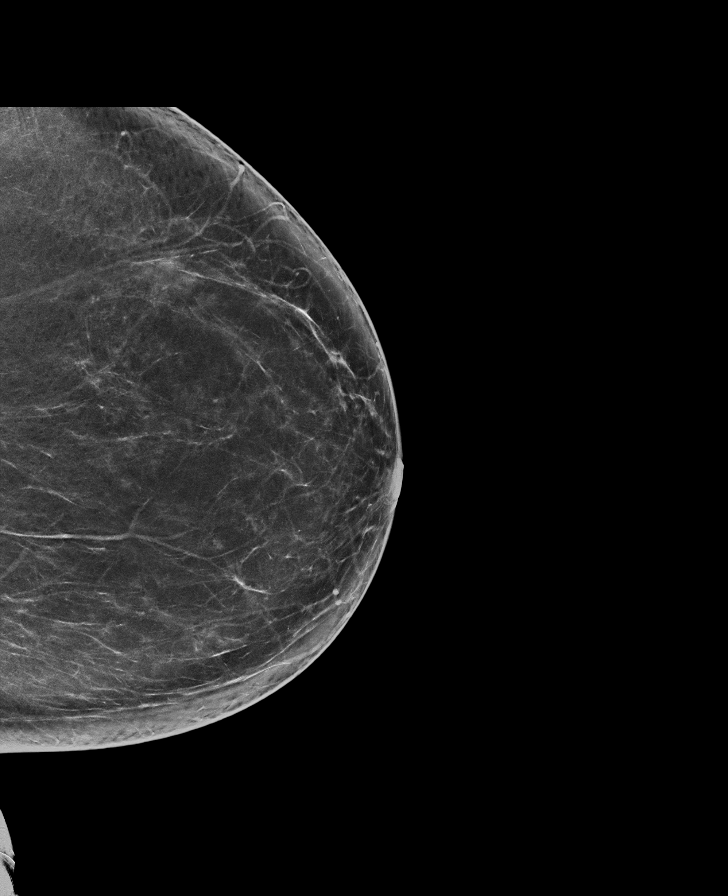

[L MLO synth-2D]
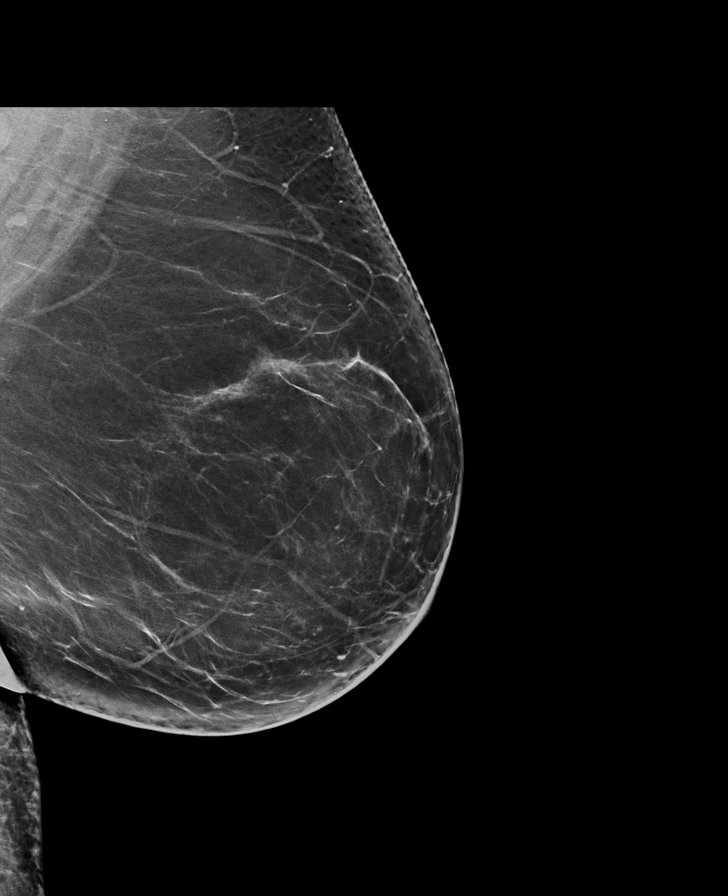

[L CC tomo · tomo slice 41/81.0]
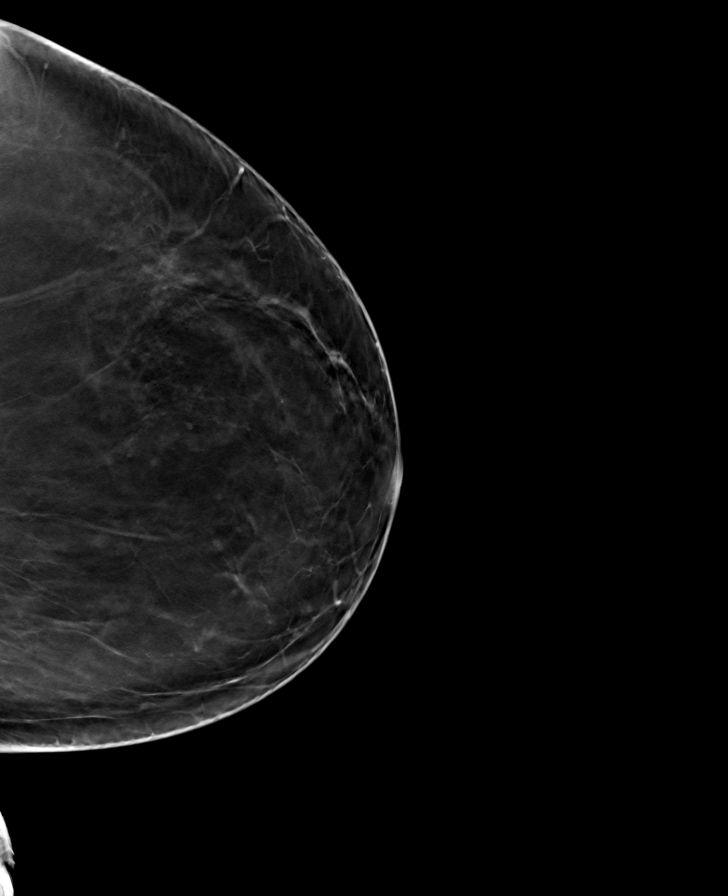

[R MLO tomo · tomo slice 45/88.0]
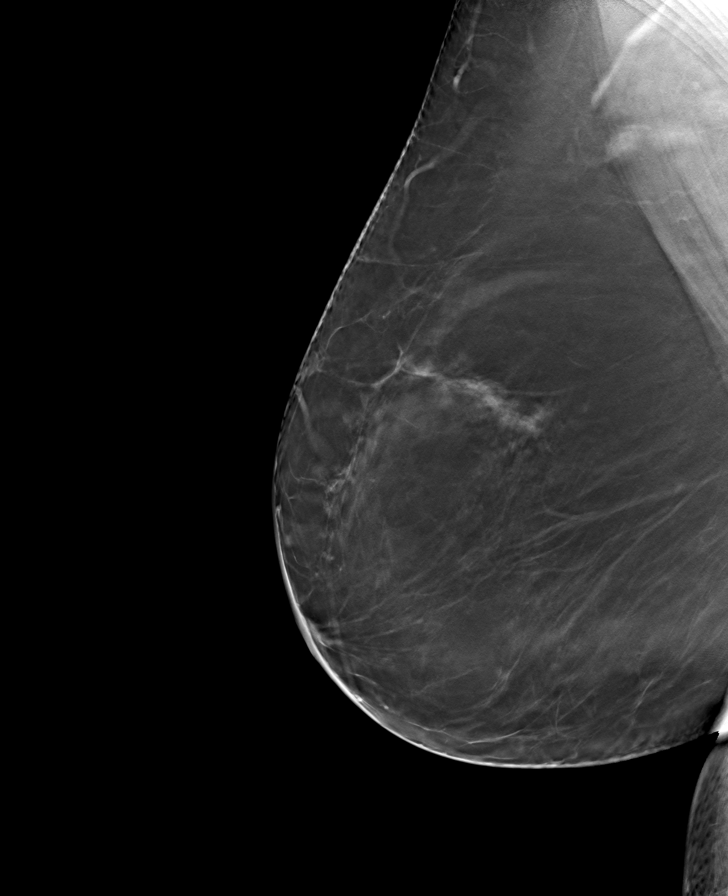

[L MLO tomo · tomo slice 43/86.0]
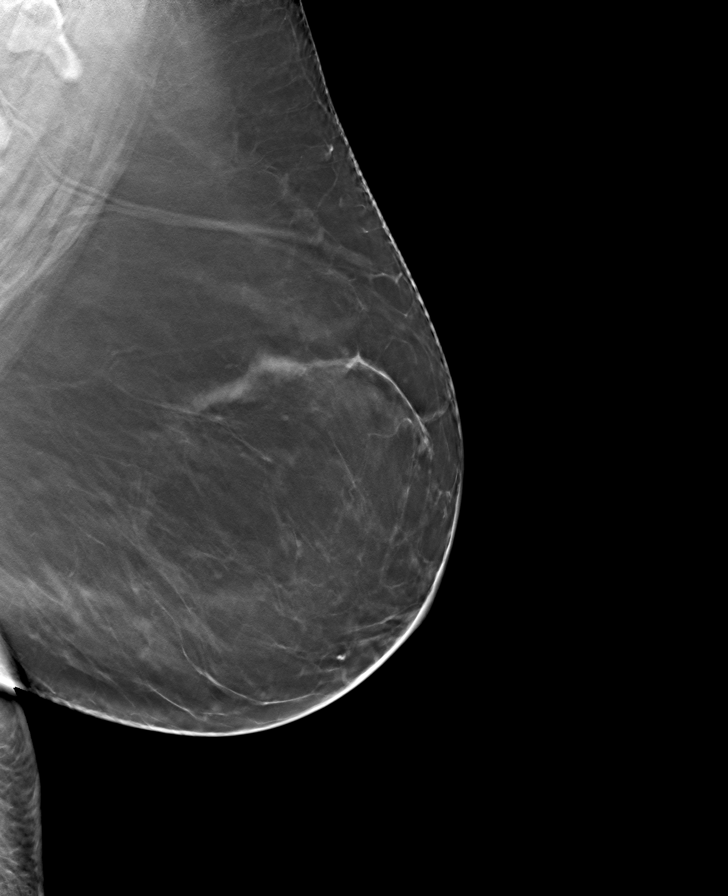

[R CC tomo · tomo slice 39/77.0]
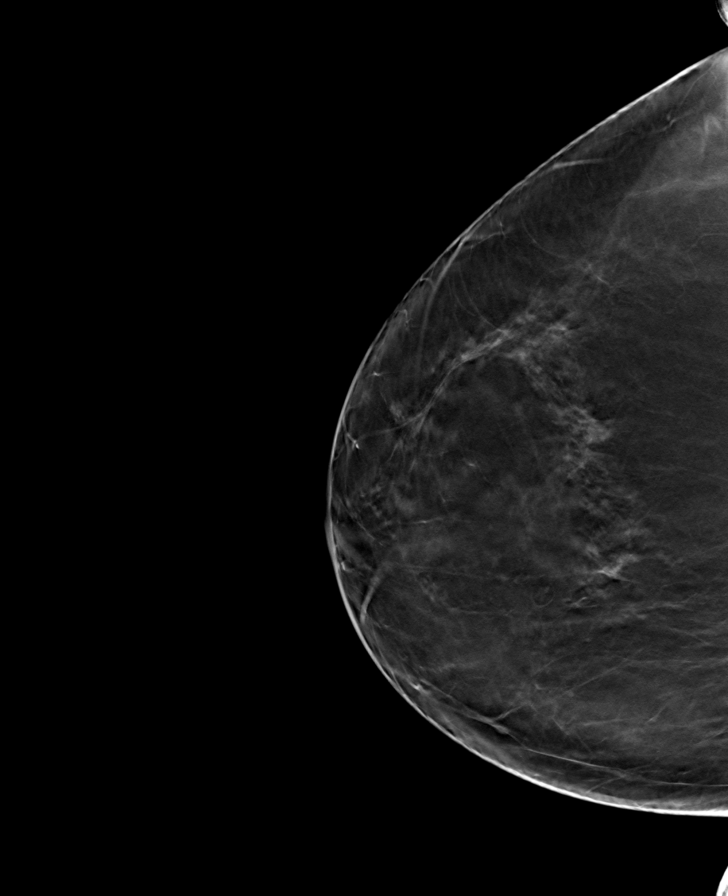

[8 of 24 positions shown; findings below may reference images not displayed]

ACR Breast Density Category b: There are scattered areas of
fibroglandular density.
FINDINGS: There are no findings suspicious for malignancy.
IMPRESSION: No mammographic evidence of malignancy. A result letter of this
screening mammogram will be mailed directly to the patient.

RECOMMENDATION:
Screening mammogram in one year. (Code:51-O-LD2)

BI-RADS CATEGORY  1: Negative.

## 2023-05-25 ENCOUNTER — Ambulatory Visit
Admission: EM | Admit: 2023-05-25 | Discharge: 2023-05-25 | Disposition: A | Discharge status: Home / Self Care | Payer: BLUE CROSS/BLUE SHIELD | Attending: Nurse Practitioner | Admitting: Nurse Practitioner

## 2023-05-25 DIAGNOSIS — B9689 Other specified bacterial agents as the cause of diseases classified elsewhere: Secondary | ICD-10-CM | POA: Diagnosis not present

## 2023-05-25 DIAGNOSIS — J019 Acute sinusitis, unspecified: Secondary | ICD-10-CM

## 2023-05-25 LAB — POCT RAPID STREP A (OFFICE): Rapid Strep A Screen: NEGATIVE

## 2023-05-25 MED ORDER — BENZONATATE 100 MG PO CAPS: 100.0000 mg | ORAL_CAPSULE | Freq: Three times a day (TID) | ORAL | 0 refills | Status: DC | PRN

## 2023-05-25 MED ORDER — DOXYCYCLINE HYCLATE 100 MG PO CAPS: 100.0000 mg | ORAL_CAPSULE | Freq: Two times a day (BID) | ORAL | 0 refills | Status: AC

## 2023-05-25 NOTE — Discharge Instructions (Addendum)
You have a bacterial sinus infection.  Take the doxycycline as prescribed to treat it.    Some things that can make you feel better are: - Increased rest - Increasing fluid with water/sugar free electrolytes - Acetaminophen and ibuprofen as needed for fever/pain - Salt water gargling, chloraseptic spray and throat lozenges for sore throat - OTC guaifenesin (Mucinex) 600 mg twice daily for congestion - Saline sinus flushes or a neti pot - Humidifying the air -Tessalon Perles as needed for dry cough

## 2023-05-25 NOTE — ED Provider Notes (Signed)
[facility]    CSN: [id number] Arrival date & time: 05/25/23  4098      History   Chief Complaint Chief Complaint  Patient presents with   Sore Throat    HPI Tammy Webster is a 62 y.o. female.   Patient presents today with 1 week history of congested cough, stuffy nose, sore throat, headache, sharp pain in her right ear without drainage, fatigue, and bodyaches worsened the past few days.  No known fevers, shortness of breath or chest pain, runny nose, abdominal pain, nausea/vomiting, diarrhea, decreased appetite, known sick contacts.  Patient has been taking DayQuil and NyQuil as well as Tylenol for symptoms with minimal improvement.  Patient denies history of chronic lung disease.  Reports she is a former smoker, quit smoking approximately 13 years ago, however smoked 2 packs/day for about 30 years.  She has needed inhalers as an adult when she was sick.  Reports last antibiotic use was 2 months ago for sinus symptoms she took Augmentin.    Past Medical History:  Diagnosis Date   Anxiety    Arthritis    Chronic back pain    Chronic constipation    COVID-19    Elevated BP without diagnosis of hypertension    Gastric ulcer    Headache(784.0)    Helicobacter pylori gastritis    Treated 2013   History of anemia    History of bronchitis    History of pneumonia    Hyperlipidemia    Insomnia    Morbid obesity (HCC)    MRSA infection    2007-2009    Patient Active Problem List   Diagnosis Date Noted   Spinal stenosis, lumbar region, with neurogenic claudication 02/28/2014   Dyslipidemia 02/11/2014   Cardiomegaly 01/26/2014   DOE (dyspnea on exertion) 01/26/2014   Preoperative cardiovascular examination 01/26/2014   Right knee pain 11/03/2012   Low back pain 11/03/2012   Helicobacter pylori gastritis 06/29/2012   Hemorrhoids 06/29/2012   Epigastric pain 04/02/2012   GERD (gastroesophageal reflux disease) 04/02/2012   Constipation, chronic 04/02/2012    Esophageal dysphagia 04/02/2012   Rectal bleeding 04/02/2012    Past Surgical History:  Procedure Laterality Date   CARPAL TUNNEL RELEASE Bilateral    COLONOSCOPY  04/13/2012   Rourk-anal canal hemorrhoids/normal colon,rectum   ENDOSCOPIC CONCHA BULLOSA RESECTION Bilateral 09/08/2017   Procedure: ENDOSCOPIC CONCHA BULLOSA RESECTION;  Surgeon: Newman Pies, MD;  Location: [facility];  Service: ENT;  Laterality: Bilateral;   ESOPHAGOGASTRODUODENOSCOPY  04/13/2012   Rourk-normal esophaus s/p dilation/h pylori gastritis   GALLBLADDER SURGERY  12/23/2002   MALONEY DILATION  04/13/2012   86F   NASAL SEPTOPLASTY W/ TURBINOPLASTY Bilateral 09/08/2017   Procedure: NASAL SEPTOPLASTY WITH TURBINATE REDUCTION;  Surgeon: Newman Pies, MD;  Location: [facility];  Service: ENT;  Laterality: Bilateral;   SAVORY DILATION  04/13/2012   TUBAL LIGATION      OB History   No obstetric history on file.      Home Medications    Prior to Admission medications   Medication Sig Start Date End Date Taking? Authorizing Provider  benzonatate (TESSALON) 100 MG capsule Take 1 capsule (100 mg total) by mouth 3 (three) times daily as needed for cough. Do not take with alcohol or while driving or operating heavy machinery.  May cause drowsiness. 05/25/23  Yes Valentino Nose, NP  doxycycline (VIBRAMYCIN) 100 MG capsule Take 1 capsule (100 mg total) by mouth 2 (two) times daily  for 7 days. 05/25/23 06/24/2023 Yes Valentino Nose, NP  conjugated estrogens (PREMARIN) vaginal cream 0.5g twice per week 08/29/22   Myna Hidalgo, DO  meloxicam (MOBIC) 15 MG tablet Take 15 mg by mouth daily. 06/20/22   [provider]  cetirizine (ZYRTEC) 10 MG tablet Take 1 tablet (10 mg total) by mouth daily. 01/29/20 05/03/21  Wurst, Lowanda Foster, PA-C  fluticasone (FLONASE) 50 MCG/ACT nasal spray Place 2 sprays into both nostrils daily. 01/29/20 05/03/21  Rennis Harding, PA-C    Family History Family History   Problem Relation Age of Onset   Healthy Mother    Colon polyps Father        heart disease - stenting, "shocked heart twice"   Gastric cancer Father        deceased Jun 23, 2012   Heart disease Maternal Grandmother 2044-06-23   Breast cancer Maternal Grandmother 2   Heart disease Maternal Grandfather 9       also MI   Heart disease Paternal Grandmother 73   Diabetes Paternal Grandmother    Heart disease Paternal Grandfather 63   Heart failure Paternal Grandfather        enlarged heart   Liver disease Neg Hx    Colon cancer Neg Hx     Social History Social History   Tobacco Use   Smoking status: Former    Packs/day: 2.00    Years: 30.00    Additional pack years: 0.00    Total pack years: 60.00    Types: Cigarettes   Smokeless tobacco: Never  Vaping Use   Vaping Use: Never used  Substance Use Topics   Alcohol use: No   Drug use: No     Allergies   Patient has no known allergies.   Review of Systems Review of Systems Per HPI  Physical Exam Triage Vital Signs ED Triage Vitals  Enc Vitals Group     BP 05/25/23 0955 (!) 140/87     Pulse Rate 05/25/23 0955 75     Resp 05/25/23 0955 18     Temp 05/25/23 0955 98.3 F (36.8 C)     Temp Source 05/25/23 0955 Oral     SpO2 05/25/23 0955 97 %     Weight --      Height --      Head Circumference --      Peak Flow --      Pain Score 05/25/23 0954 5     Pain Loc --      Pain Edu? --      Excl. in GC? --    No data found.  Updated Vital Signs BP (!) 140/87 (BP Location: Right Arm)   Pulse 75   Temp 98.3 F (36.8 C) (Oral)   Resp 18   SpO2 97%   Visual Acuity Right Eye Distance:   Left Eye Distance:   Bilateral Distance:    Right Eye Near:   Left Eye Near:    Bilateral Near:     Physical Exam Vitals and nursing note reviewed.  Constitutional:      General: She is not in acute distress.    Appearance: Normal appearance. She is not ill-appearing or toxic-appearing.  HENT:     Head: Normocephalic and  atraumatic.     Right Ear: Tympanic membrane, ear canal and external ear normal. No drainage, swelling or tenderness. No middle ear effusion. Tympanic membrane is not erythematous.     Left Ear: Tympanic membrane, ear canal and external ear normal.  No drainage, swelling or tenderness.  No middle ear effusion. Tympanic membrane is not erythematous.     Nose: Congestion and rhinorrhea present.     Right Sinus: Maxillary sinus tenderness present. No frontal sinus tenderness.     Left Sinus: Maxillary sinus tenderness present. No frontal sinus tenderness.     Mouth/Throat:     Mouth: Mucous membranes are moist.     Pharynx: Oropharynx is clear. No oropharyngeal exudate or posterior oropharyngeal erythema.     Tonsils: No tonsillar exudate. 0 on the right. 0 on the left.  Eyes:     General: No scleral icterus.    Extraocular Movements: Extraocular movements intact.  Cardiovascular:     Rate and Rhythm: Normal rate and regular rhythm.  Pulmonary:     Effort: Pulmonary effort is normal. No respiratory distress.     Breath sounds: Normal breath sounds. No wheezing, rhonchi or rales.  Abdominal:     General: Abdomen is flat. Bowel sounds are normal. There is no distension.     Palpations: Abdomen is soft.  Musculoskeletal:     Cervical back: Normal range of motion and neck supple.  Lymphadenopathy:     Cervical: No cervical adenopathy.  Skin:    General: Skin is warm and dry.     Capillary Refill: Capillary refill takes less than 2 seconds.     Coloration: Skin is not jaundiced or pale.     Findings: No erythema or rash.  Neurological:     Mental Status: She is alert and oriented to person, place, and time.     Motor: No weakness.  Psychiatric:        Behavior: Behavior is cooperative.      UC Treatments / Results  Labs (all labs ordered are listed, but only abnormal results are displayed) Labs Reviewed  POCT RAPID STREP A (OFFICE)    EKG   Radiology No results  found.  Procedures Procedures (including critical care time)  Medications Ordered in UC Medications - No data to display  Initial Impression / Assessment and Plan / UC Course  I have reviewed the triage vital signs and the nursing notes.  Pertinent labs & imaging results that were available during my care of the patient were reviewed by me and considered in my medical decision making (see chart for details).   Patient is well-appearing, normotensive, afebrile, not tachycardic, not tachypneic, oxygenating well on room air.    1. Acute bacterial sinusitis Given recent antibiotic use, will treat with doxycycline twice daily for 7 days Supportive care discussed with patient Start cough suppressant medication ER and return precautions discussed  The patient was given the opportunity to ask questions.  All questions answered to their satisfaction.  The patient is in agreement to this plan.    Final Clinical Impressions(s) / UC Diagnoses   Final diagnoses:  Acute bacterial sinusitis     Discharge Instructions      You have a bacterial sinus infection.  Take the doxycycline as prescribed to treat it.    Some things that can make you feel better are: - Increased rest - Increasing fluid with water/sugar free electrolytes - Acetaminophen and ibuprofen as needed for fever/pain - Salt water gargling, chloraseptic spray and throat lozenges for sore throat - OTC guaifenesin (Mucinex) 600 mg twice daily for congestion - Saline sinus flushes or a neti pot - Humidifying the air -Tessalon Perles as needed for dry cough     ED Prescriptions  Medication Sig Dispense Auth. Provider   doxycycline (VIBRAMYCIN) 100 MG capsule Take 1 capsule (100 mg total) by mouth 2 (two) times daily for 7 days. 14 capsule Cathlean Marseilles A, NP   benzonatate (TESSALON) 100 MG capsule Take 1 capsule (100 mg total) by mouth 3 (three) times daily as needed for cough. Do not take with alcohol or while  driving or operating heavy machinery.  May cause drowsiness. 21 capsule Valentino Nose, NP      PDMP not reviewed this encounter.   Valentino Nose, NP 05/25/23 1234

## 2023-05-25 NOTE — ED Triage Notes (Signed)
Pt reports she has throat pain, right ear pain and body aches x 3 days Took dayquil, nyquil, and tylenol but no relief.

## 2024-05-26 ENCOUNTER — Other Ambulatory Visit (HOSPITAL_COMMUNITY): Payer: Self-pay | Admitting: Family Medicine

## 2024-05-26 DIAGNOSIS — Z1231 Encounter for screening mammogram for malignant neoplasm of breast: Secondary | ICD-10-CM

## 2024-05-31 ENCOUNTER — Encounter (INDEPENDENT_AMBULATORY_CARE_PROVIDER_SITE_OTHER): Payer: Self-pay | Admitting: *Deleted

## 2024-05-31 ENCOUNTER — Ambulatory Visit (HOSPITAL_COMMUNITY)
Admission: RE | Admit: 2024-05-31 | Discharge: 2024-05-31 | Disposition: A | Discharge status: Home / Self Care | Source: Ambulatory Visit | Attending: Family Medicine | Admitting: Family Medicine

## 2024-05-31 DIAGNOSIS — Z1231 Encounter for screening mammogram for malignant neoplasm of breast: Secondary | ICD-10-CM | POA: Insufficient documentation

## 2024-06-09 ENCOUNTER — Telehealth: Payer: Self-pay

## 2024-06-09 DIAGNOSIS — Z860100 Personal history of colon polyps, unspecified: Secondary | ICD-10-CM

## 2024-06-09 NOTE — Telephone Encounter (Signed)
 Who is your primary care physician: Dr.Golding  Reasons for the colonoscopy: Hx polyps  Have you had a colonoscopy before?  Yes 04/13/2012 Dr.Rourk  Do you have family history of colon cancer? no  Previous colonoscopy with polyps removed? yes  Do you have a history colorectal cancer?   no  Are you diabetic? If yes, Type 1 or Type 2?    no  Do you have a prosthetic or mechanical heart valve? no  Do you have a pacemaker/defibrillator?   no  Have you had endocarditis/atrial fibrillation? no  Have you had joint replacement within the last 12 months?  no  Do you tend to be constipated or have to use laxatives? Yes   Do you have any history of drugs or alchohol?  no  Do you use supplemental oxygen?  no  Have you had a stroke or heart attack within the last 6 months? no  Do you take weight loss medication?  no  For female patients: have you had a hysterectomy?  no                                     are you post menopausal?       yes                                            do you still have your menstrual cycle? no      Do you take any blood-thinning medications such as: (aspirin, warfarin, Plavix, Aggrenox)  yes  If yes we need the name, milligram, dosage and who is prescribing doctor Aspirin and BC powder for headaches Current Outpatient Medications on File Prior to Visit  Medication Sig Dispense Refill   benzonatate  (TESSALON ) 100 MG capsule Take 1 capsule (100 mg total) by mouth 3 (three) times daily as needed for cough. Do not take with alcohol or while driving or operating heavy machinery.  May cause drowsiness. 21 capsule 0   conjugated estrogens  (PREMARIN ) vaginal cream 0.5g twice per week 96.42 g 4   meloxicam (MOBIC) 15 MG tablet Take 15 mg by mouth daily.     [DISCONTINUED] cetirizine  (ZYRTEC ) 10 MG tablet Take 1 tablet (10 mg total) by mouth daily. 30 tablet 0   [DISCONTINUED] fluticasone  (FLONASE ) 50 MCG/ACT nasal spray Place 2 sprays into both nostrils daily. 16 g  0   No current facility-administered medications on file prior to visit.    No Known Allergies   Pharmacy: [facility] [address]  Primary Insurance Name: Estel Heir  [id number]  Best number where you can be reached: [phone]

## 2024-07-27 NOTE — Telephone Encounter (Signed)
 Colonoscopy 2013: hemorrhoids, otherwise normal.   ASA 2. Needs BMP prior.

## 2024-07-28 ENCOUNTER — Other Ambulatory Visit: Payer: Self-pay | Admitting: *Deleted

## 2024-07-28 ENCOUNTER — Encounter: Payer: Self-pay | Admitting: *Deleted

## 2024-07-28 DIAGNOSIS — Z860100 Personal history of colon polyps, unspecified: Secondary | ICD-10-CM

## 2024-07-28 MED ORDER — PEG 3350-KCL-NA BICARB-NACL 420 G PO SOLR: 4000.0000 mL | Freq: Once | ORAL | 0 refills | Status: AC

## 2024-07-28 NOTE — Telephone Encounter (Signed)
 Pt has been scheduled for 08/19/24. Instructions mailed and prep sent to pharmacy.

## 2024-07-28 NOTE — Telephone Encounter (Signed)
 Carelon PA: Order ID: [id number]       Authorized  Approval Valid Through: 07/28/2024 - 09/25/2024

## 2024-07-29 ENCOUNTER — Encounter (INDEPENDENT_AMBULATORY_CARE_PROVIDER_SITE_OTHER): Payer: Self-pay | Admitting: *Deleted

## 2024-07-29 NOTE — Telephone Encounter (Signed)
 Referral completed, [facility] apt letter sent to PCP

## 2024-08-02 NOTE — Addendum Note (Signed)
 Addended by: JEANELL GRAEME RAMAN on: 08/02/2024 04:16 PM   Modules accepted: Orders

## 2024-08-02 NOTE — Telephone Encounter (Signed)
 PT called in and reported lab order should have been for [facility]. Went and was told no order in. Looks like was for [facility]. Order placed for [facility]

## 2024-08-13 LAB — BASIC METABOLIC PANEL WITH GFR
BUN/Creatinine Ratio: 21 (ref 12–28)
BUN: 16 mg/dL (ref 8–27)
CO2: 21 mmol/L (ref 20–29)
Calcium: 9.6 mg/dL (ref 8.7–10.3)
Chloride: 102 mmol/L (ref 96–106)
Creatinine, Ser: 0.76 mg/dL (ref 0.57–1.00)
Glucose: 96 mg/dL (ref 70–99)
Potassium: 4.8 mmol/L (ref 3.5–5.2)
Sodium: 139 mmol/L (ref 134–144)
eGFR: 89 mL/min/1.73 (ref >59)

## 2024-08-19 ENCOUNTER — Encounter (HOSPITAL_COMMUNITY)
Admission: RE | Disposition: A | Discharge status: Home / Self Care | Payer: Self-pay | Source: Home / Self Care | Attending: Internal Medicine

## 2024-08-19 ENCOUNTER — Other Ambulatory Visit: Payer: Self-pay

## 2024-08-19 ENCOUNTER — Ambulatory Visit (HOSPITAL_COMMUNITY): Admitting: Anesthesiology

## 2024-08-19 ENCOUNTER — Ambulatory Visit (HOSPITAL_COMMUNITY)
Admission: RE | Admit: 2024-08-19 | Discharge: 2024-08-19 | Disposition: A | Discharge status: Home / Self Care | Attending: Internal Medicine | Admitting: Internal Medicine

## 2024-08-19 ENCOUNTER — Encounter (HOSPITAL_COMMUNITY): Payer: Self-pay | Admitting: Internal Medicine

## 2024-08-19 DIAGNOSIS — Z6839 Body mass index (BMI) 39.0-39.9, adult: Secondary | ICD-10-CM | POA: Diagnosis not present

## 2024-08-19 DIAGNOSIS — G8929 Other chronic pain: Secondary | ICD-10-CM | POA: Diagnosis not present

## 2024-08-19 DIAGNOSIS — K648 Other hemorrhoids: Secondary | ICD-10-CM | POA: Diagnosis not present

## 2024-08-19 DIAGNOSIS — D175 Benign lipomatous neoplasm of intra-abdominal organs: Secondary | ICD-10-CM

## 2024-08-19 DIAGNOSIS — E66813 Obesity, class 3: Secondary | ICD-10-CM | POA: Diagnosis not present

## 2024-08-19 DIAGNOSIS — Z83719 Family history of colon polyps, unspecified: Secondary | ICD-10-CM | POA: Insufficient documentation

## 2024-08-19 DIAGNOSIS — M549 Dorsalgia, unspecified: Secondary | ICD-10-CM | POA: Diagnosis not present

## 2024-08-19 DIAGNOSIS — Z87891 Personal history of nicotine dependence: Secondary | ICD-10-CM | POA: Insufficient documentation

## 2024-08-19 DIAGNOSIS — K5730 Diverticulosis of large intestine without perforation or abscess without bleeding: Secondary | ICD-10-CM

## 2024-08-19 DIAGNOSIS — Z1211 Encounter for screening for malignant neoplasm of colon: Secondary | ICD-10-CM

## 2024-08-19 DIAGNOSIS — K6289 Other specified diseases of anus and rectum: Secondary | ICD-10-CM

## 2024-08-19 HISTORY — PX: COLONOSCOPY: SHX5424

## 2024-08-19 SURGERY — COLONOSCOPY
Anesthesia: General

## 2024-08-19 MED ORDER — PROPOFOL 10 MG/ML IV BOLUS
INTRAVENOUS | Status: DC | PRN
  Administered 2024-08-19: 150 mg via INTRAVENOUS
  Administered 2024-08-19: 200 ug/kg/min via INTRAVENOUS

## 2024-08-19 MED ORDER — LIDOCAINE 2% (20 MG/ML) 5 ML SYRINGE
INTRAMUSCULAR | Status: DC | PRN
  Administered 2024-08-19: 80 mg via INTRAVENOUS

## 2024-08-19 MED ORDER — STERILE WATER FOR IRRIGATION IR SOLN
Status: DC | PRN
  Administered 2024-08-19: 120 mL

## 2024-08-19 MED ORDER — LACTATED RINGERS IV SOLN: INTRAVENOUS | Status: DC

## 2024-08-19 MED ORDER — LACTATED RINGERS IV SOLN: INTRAVENOUS | Status: DC | PRN

## 2024-08-19 NOTE — Op Note (Signed)
 [facility] Patient Name: Tammy Webster Procedure Date: 08/19/2024 12:45 PM MRN: [id number] Date of Birth: 05/17/1961 Attending MD: Lamar Ozell Hollingshead , MD, [id number] CSN: [id number] Age: 63 Admit Type: Outpatient Procedure:                Colonoscopy Indications:              Screening for colorectal malignant neoplasm Providers:                Lamar Ozell Hollingshead, MD, Madelin Hunter, RN, [place]                            Wilson, Technician Referring MD:              Medicines:                Propofol  per Anesthesia Complications:            No immediate complications. Estimated Blood Loss:     Estimated blood loss: none. Procedure:                Pre-Anesthesia Assessment:                           - Prior to the procedure, a History and Physical                            was performed, and patient medications and                            allergies were reviewed. The patient's tolerance of                            previous anesthesia was also reviewed. The risks                            and benefits of the procedure and the sedation                            options and risks were discussed with the patient.                            All questions were answered, and informed consent                            was obtained. Prior Anticoagulants: The patient has                            taken no anticoagulant or antiplatelet agents. ASA                            Grade Assessment: III - A patient with severe                            systemic disease. After reviewing the risks and  benefits, the patient was deemed in satisfactory                            condition to undergo the procedure.                           After obtaining informed consent, the colonoscope                            was passed under direct vision. Throughout the                            procedure, the patient's blood pressure, pulse, and                             oxygen saturations were monitored continuously. The                            [id number] ([id number]) Colon was introduced through                            the anus and advanced to the the cecum, identified                            by appendiceal orifice and ileocecal valve. The                            colonoscopy was performed without difficulty. The                            patient tolerated the procedure well. The quality                            of the bowel preparation was adequate. The                            ileocecal valve, appendiceal orifice, and rectum                            were photographed. The patient tolerated the                            procedure well. The quality of the bowel                            preparation was adequate. Scope In: 1:15:37 PM Scope Out: 1:28:30 PM Scope Withdrawal Time: 0 hours 9 minutes 18 seconds  Total Procedure Duration: 0 hours 12 minutes 53 seconds  Findings:      The perianal and digital rectal examinations were normal. 8 mm soft       submucosal nodule descending segment consistent with lipoma positive       pillow sign.      Scattered medium-mouthed diverticula were found in the sigmoid colon and       descending colon.  The exam was otherwise without abnormality on direct and retroflexion       views. Internal hemorrhoids and anal papilla Impression:               - Diverticulosis in the sigmoid colon and in the                            descending colon. Descending colon lipoma. Anal                            papilla and internal hemorrhoids.                           - The examination was otherwise normal on direct                            and retroflexion views.                           - No specimens collected. Moderate Sedation:      Moderate (conscious) sedation was personally administered by an       anesthesia professional. The following parameters were monitored: oxygen       saturation, heart rate,  blood pressure, respiratory rate, EKG, adequacy       of pulmonary ventilation, and response to care. Recommendation:           - Patient has a contact number available for                            emergencies. The signs and symptoms of potential                            delayed complications were discussed with the                            patient. Return to normal activities tomorrow.                            Written discharge instructions were provided to the                            patient.                           - Advance diet as tolerated.                           - Continue present medications.                           - Repeat colonoscopy in 10 weeks for screening                            purposes.                           - Return to GI office (date not yet  determined). Procedure Code(s):        --- Professional ---                           [phone], Colonoscopy, flexible; diagnostic, including                            collection of specimen(s) by brushing or washing,                            when performed (separate procedure) Diagnosis Code(s):        --- Professional ---                           Z12.11, Encounter for screening for malignant                            neoplasm of colon                           K57.30, Diverticulosis of large intestine without                            perforation or abscess without bleeding CPT copyright 2022 [employer] Association. All rights reserved. The codes documented in this report are preliminary and upon coder review may  be revised to meet current compliance requirements. Lamar HERO. Mikhi Athey, MD Lamar Ozell Hollingshead, MD 08/19/2024 3:00:25 PM This report has been signed electronically. Number of Addenda: 0

## 2024-08-19 NOTE — Transfer of Care (Signed)
 Immediate Anesthesia Transfer of Care Note  Patient: Tammy Webster  Procedure(s) Performed: COLONOSCOPY  Patient Location: Endoscopy Unit  Anesthesia Type:General  Level of Consciousness: awake, alert , and oriented  Airway & Oxygen Therapy: Patient Spontanous Breathing  Post-op Assessment: Report given to RN and Post -op Vital signs reviewed and stable  Post vital signs: Reviewed and stable  Last Vitals:  Vitals Value Taken Time  BP 104/48 08/19/24 13:33  Temp 36.5 C 08/19/24 13:33  Pulse 75 08/19/24 13:33  Resp 20 08/19/24 13:33  SpO2 98 % 08/19/24 13:33    Last Pain:  Vitals:   08/19/24 1333  TempSrc: Oral  PainSc: 0-No pain      Patients Stated Pain Goal: 4 (08/19/24 1121)  Complications: No notable events documented.

## 2024-08-19 NOTE — H&P (Signed)
 @LOGO @   Primary Care Physician:  Marvine Rush, MD Primary Gastroenterologist:  Dr. Shaaron  Pre-Procedure History & Physical: HPI:  Tammy Webster is a 63 y.o. female here for  screening colonoscopy.   Negative colonoscopy 2012-09-14.  Past Medical History:  Diagnosis Date   Anxiety    Arthritis    Chronic back pain    Chronic constipation    COVID-19    Elevated BP without diagnosis of hypertension    Gastric ulcer    Headache(784.0)    Helicobacter pylori gastritis    Treated 09-14-12   History of anemia    History of bronchitis    History of pneumonia    Hyperlipidemia    Insomnia    Morbid obesity (HCC)    MRSA infection    2007-2009    Past Surgical History:  Procedure Laterality Date   CARPAL TUNNEL RELEASE Bilateral    COLONOSCOPY  04/13/2012   Aubreana Cornacchia-anal canal hemorrhoids/normal colon,rectum   ENDOSCOPIC CONCHA BULLOSA RESECTION Bilateral 09/08/2017   Procedure: ENDOSCOPIC CONCHA BULLOSA RESECTION;  Surgeon: Karis Clunes, MD;  Location: [facility];  Service: ENT;  Laterality: Bilateral;   ESOPHAGOGASTRODUODENOSCOPY  04/13/2012   Tempestt Silba-normal esophaus s/p dilation/h pylori gastritis   GALLBLADDER SURGERY  12/23/2002   MALONEY DILATION  04/13/2012   56F   NASAL SEPTOPLASTY W/ TURBINOPLASTY Bilateral 09/08/2017   Procedure: NASAL SEPTOPLASTY WITH TURBINATE REDUCTION;  Surgeon: Karis Clunes, MD;  Location: [facility];  Service: ENT;  Laterality: Bilateral;   SAVORY DILATION  04/13/2012   TOTAL KNEE ARTHROPLASTY Right 2022-09-14   TUBAL LIGATION      Prior to Admission medications   Medication Sig Start Date End Date Taking? Authorizing Provider  conjugated estrogens  (PREMARIN ) vaginal cream 0.5g twice per week 08/29/22  Yes Ozan, Jennifer, DO  meloxicam (MOBIC) 15 MG tablet Take 15 mg by mouth daily.   Yes [provider]  olmesartan-hydrochlorothiazide (BENICAR HCT) 40-25 MG tablet Take 1 tablet by mouth daily.    [provider]   rosuvastatin (CRESTOR) 10 MG tablet Take 10 mg by mouth daily.    [provider]  cetirizine  (ZYRTEC ) 10 MG tablet Take 1 tablet (10 mg total) by mouth daily. 01/29/20 05/03/21  Wurst, [place], PA-C  fluticasone  (FLONASE ) 50 MCG/ACT nasal spray Place 2 sprays into both nostrils daily. 01/29/20 05/03/21  Martell Grate, PA-C    Allergies as of 07/28/2024   (No Known Allergies)    Family History  Problem Relation Age of Onset   Healthy Mother    Colon polyps Father        heart disease - stenting, shocked heart twice   Gastric cancer Father        deceased 09-14-12   Heart disease Maternal Grandmother 14-Sep-2044   Breast cancer Maternal Grandmother 85   Heart disease Maternal Grandfather 65       also MI   Heart disease Paternal Grandmother 65   Diabetes Paternal Grandmother    Heart disease Paternal Grandfather 28   Heart failure Paternal Grandfather        enlarged heart   Liver disease Neg Hx    Colon cancer Neg Hx     Social History   Socioeconomic History   Marital status: Married    Spouse name: Not on file   Number of children: 2   Years of education: Not on file   Highest education level: Not on file  Occupational History   Not on file  Tobacco Use   Smoking status: Former    Current packs/day: 2.00    Average packs/day: 2.0 packs/day for 30.0 years (60.0 ttl pk-yrs)    Types: Cigarettes   Smokeless tobacco: Never  Vaping Use   Vaping status: Never Used  Substance and Sexual Activity   Alcohol use: No   Drug use: No   Sexual activity: Not on file  Other Topics Concern   Not on file  Social History Narrative   Not on file   Social Drivers of Health   Financial Resource Strain: Low Risk  (08/29/2022)   Overall Financial Resource Strain (CARDIA)    Difficulty of Paying Living Expenses: Not very hard  Food Insecurity: No Food Insecurity (08/29/2022)   Hunger Vital Sign    Worried About Running Out of Food in the Last Year: Never true    Ran Out of Food in  the Last Year: Never true  Transportation Needs: No Transportation Needs (08/29/2022)   PRAPARE - Administrator, Civil Service (Medical): No    Lack of Transportation (Non-Medical): No  Physical Activity: Insufficiently Active (08/29/2022)   Exercise Vital Sign    Days of Exercise per Week: 2 days    Minutes of Exercise per Session: 20 min  Stress: No Stress Concern Present (08/29/2022)   [employer] of Occupational Health - Occupational Stress Questionnaire    Feeling of Stress : Not at all  Social Connections: Moderately Integrated (08/29/2022)   Social Connection and Isolation Panel    Frequency of Communication with Friends and Family: More than three times a week    Frequency of Social Gatherings with Friends and Family: More than three times a week    Attends Religious Services: 1 to 4 times per year    Active Member of [employer] or Organizations: No    Attends Banker Meetings: Never    Marital Status: Married  Catering manager Violence: Not At Risk (08/29/2022)   Humiliation, Afraid, Rape, and Kick questionnaire    Fear of Current or Ex-Partner: No    Emotionally Abused: No    Physically Abused: No    Sexually Abused: No    Review of Systems: See HPI, otherwise negative ROS  Physical Exam: BP (!) 160/71   Pulse (!) 59   Temp 98.2 F (36.8 C) (Oral)   Resp 16   Ht 5' 6 (1.676 m)   Wt 111.1 kg   SpO2 97%   BMI 39.54 kg/m  General:   Alert,  Well-developed, well-nourished, pleasant and cooperative in NAD Neck:  Supple; no masses or thyromegaly. No significant cervical adenopathy. Lungs:  Clear throughout to auscultation.   No wheezes, crackles, or rhonchi. No acute distress. Heart:  Regular rate and rhythm; no murmurs, clicks, rubs,  or gallops. Abdomen: Non-distended, normal bowel sounds.  Soft and nontender without appreciable mass or hepatosplenomegaly.   Impression/Plan:    63 year old lady here for an average risk screening colonoscopy The  risks, benefits, limitations, alternatives and imponderables have been reviewed with the patient. Questions have been answered. All parties are agreeable.       Notice: This dictation was prepared with Dragon dictation along with smaller phrase technology. Any transcriptional errors that result from this process are unintentional and may not be corrected upon review.

## 2024-08-19 NOTE — Discharge Instructions (Addendum)
  Colonoscopy Discharge Instructions  Read the instructions outlined below and refer to this sheet in the next few weeks. These discharge instructions provide you with general information on caring for yourself after you leave the hospital. Your doctor may also give you specific instructions. While your treatment has been planned according to the most current medical practices available, unavoidable complications occasionally occur. If you have any problems or questions after discharge, call Dr. Shaaron at [phone]. ACTIVITY You may resume your regular activity, but move at a slower pace for the next 24 hours.  Take frequent rest periods for the next 24 hours.  Walking will help get rid of the air and reduce the bloated feeling in your belly (abdomen).  No driving for 24 hours (because of the medicine (anesthesia) used during the test).   Do not sign any important legal documents or operate any machinery for 24 hours (because of the anesthesia used during the test).  NUTRITION Drink plenty of fluids.  You may resume your normal diet as instructed by your doctor.  Begin with a light meal and progress to your normal diet. Heavy or fried foods are harder to digest and may make you feel sick to your stomach (nauseated).  Avoid alcoholic beverages for 24 hours or as instructed.  MEDICATIONS You may resume your normal medications unless your doctor tells you otherwise.  WHAT YOU CAN EXPECT TODAY Some feelings of bloating in the abdomen.  Passage of more gas than usual.  Spotting of blood in your stool or on the toilet paper.  IF YOU HAD POLYPS REMOVED DURING THE COLONOSCOPY: No aspirin products for 7 days or as instructed.  No alcohol for 7 days or as instructed.  Eat a soft diet for the next 24 hours.  FINDING OUT THE RESULTS OF YOUR TEST Not all test results are available during your visit. If your test results are not back during the visit, make an appointment with your caregiver to find out the  results. Do not assume everything is normal if you have not heard from your caregiver or the medical facility. It is important for you to follow up on all of your test results.  SEEK IMMEDIATE MEDICAL ATTENTION IF: You have more than a spotting of blood in your stool.  Your belly is swollen (abdominal distention).  You are nauseated or vomiting.  You have a temperature over 101.  You have abdominal pain or discomfort that is severe or gets worse throughout the day.     Diverticulosis only found today  No polyps  Recommend 1 more screening colonoscopy in 10 years    unable to  reconcile your medication list.  Check with prescribers regarding    Appropriate dosing

## 2024-08-19 NOTE — Anesthesia Preprocedure Evaluation (Signed)
 Anesthesia Evaluation  Patient identified by MRN, date of birth, ID band Patient awake    Reviewed: Allergy & Precautions, H&P , NPO status , Patient's Chart, lab work & pertinent test results, reviewed documented beta blocker date and time   Airway Mallampati: II  TM Distance: >3 FB Neck ROM: full    Dental no notable dental hx.    Pulmonary neg pulmonary ROS, former smoker   Pulmonary exam normal breath sounds clear to auscultation       Cardiovascular Exercise Tolerance: Good hypertension, + DOE  negative cardio ROS  Rhythm:regular Rate:Normal     Neuro/Psych  Headaches  Anxiety     negative neurological ROS  negative psych ROS   GI/Hepatic negative GI ROS, Neg liver ROS, PUD,GERD  ,,  Endo/Other  negative endocrine ROS  Class 3 obesity  Renal/GU negative Renal ROS  negative genitourinary   Musculoskeletal   Abdominal   Peds  Hematology negative hematology ROS (+)   Anesthesia Other Findings   Reproductive/Obstetrics negative OB ROS                              Anesthesia Physical Anesthesia Plan  ASA: 3  Anesthesia Plan: General   Post-op Pain Management:    Induction:   PONV Risk Score and Plan: Propofol  infusion  Airway Management Planned:   Additional Equipment:   Intra-op Plan:   Post-operative Plan:   Informed Consent: I have reviewed the patients History and Physical, chart, labs and discussed the procedure including the risks, benefits and alternatives for the proposed anesthesia with the patient or authorized representative who has indicated his/her understanding and acceptance.     Dental Advisory Given  Plan Discussed with: CRNA  Anesthesia Plan Comments:         Anesthesia Quick Evaluation

## 2024-08-20 ENCOUNTER — Encounter (HOSPITAL_COMMUNITY): Payer: Self-pay | Admitting: Internal Medicine

## 2024-08-21 NOTE — Anesthesia Postprocedure Evaluation (Signed)
 Anesthesia Post Note  Patient: Tammy Webster  Procedure(s) Performed: COLONOSCOPY  Patient location during evaluation: Phase II Anesthesia Type: General Level of consciousness: awake Pain management: pain level controlled Vital Signs Assessment: post-procedure vital signs reviewed and stable Respiratory status: spontaneous breathing and respiratory function stable Cardiovascular status: blood pressure returned to baseline and stable Postop Assessment: no headache and no apparent nausea or vomiting Anesthetic complications: no Comments: Late entry   No notable events documented.   Last Vitals:  Vitals:   08/19/24 1121 08/19/24 1333  BP: (!) 160/71 (!) 104/48  Pulse: (!) 59 75  Resp: 16 20  Temp: 36.8 C 36.5 C  SpO2: 97% 98%    Last Pain:  Vitals:   08/19/24 1333  TempSrc: Oral  PainSc: 0-No pain                 Yvonna JINNY Bosworth

## 2024-09-19 ENCOUNTER — Ambulatory Visit
Admission: RE | Admit: 2024-09-19 | Discharge: 2024-09-19 | Disposition: A | Discharge status: Home / Self Care | Payer: Self-pay | Source: Ambulatory Visit | Attending: Internal Medicine | Admitting: Internal Medicine

## 2024-09-19 VITALS — BP 141/83 | HR 85 | Temp 98.2°F | Resp 20

## 2024-09-19 DIAGNOSIS — M25562 Pain in left knee: Secondary | ICD-10-CM

## 2024-09-19 MED ORDER — BACLOFEN 10 MG PO TABS: 10.0000 mg | ORAL_TABLET | Freq: Three times a day (TID) | ORAL | 0 refills | Status: AC

## 2024-09-19 MED ORDER — PREDNISONE 20 MG PO TABS: 40.0000 mg | ORAL_TABLET | Freq: Every day | ORAL | 0 refills | Status: AC

## 2024-09-19 NOTE — Discharge Instructions (Addendum)
 Your left knee pain is likely due to arthritis.   Prednisone  40mg  once daily for the next 5 days each morning with breakfast.   You may take tylenol  as needed for aches and pains.  Take muscle relaxer as needed for muscle spasm, mostly take this at bedtime as this medicine can cause drowsiness.  Apply heat to the pulled muscle 20 minutes on 20 minutes off as needed, heat relaxes muscles.  Perform gentle exercises and stretches to area of tenderness.  I would like for you to rest, however I do not want you to avoid moving the area. Movement and stretching will help with healing.  If you develop any new or worsening symptoms or if your symptoms do not start to improve, please return here or follow-up with your primary care provider. If your symptoms are severe, please go to the emergency room.

## 2024-09-19 NOTE — ED Provider Notes (Signed)
 [facility]    CSN: [id number] Arrival date & time: 09/19/24  1101      History   Chief Complaint Chief Complaint  Patient presents with   Knee Pain    Entered by patient    HPI Tammy Webster is a 63 y.o. female.   Tammy Webster is a 63 y.o. female presenting for chief complaint of left knee pain that started 5 days ago. Pain has gradually worsened over the last 5 days, more significant pain in the last 24-48 hours. Pain is mostly localized to the medial left knee and radiates proximally and distally on the medial side of the left knee when she walks. Pain improves slightly when she sits, increases with weightbearing activity. Reports history of arthritis everywhere for which she takes Mobic. Mobic has not been helping with knee pain. She is also taking tylenol  without relief of pain.  History of right knee replacement, no surgeries to the left knee.  Denies recent known trauma/injuries to the left knee, calf pain, redness/swelling of the knee, history of gout, and recent steroid use.   Tender to palpation over the left posterior calf Negative homans sign to left calf No erythema, warmth, or swelling    Knee Pain   Past Medical History:  Diagnosis Date   Anxiety    Arthritis    Chronic back pain    Chronic constipation    COVID-19    Elevated BP without diagnosis of hypertension    Gastric ulcer    Headache(784.0)    Helicobacter pylori gastritis    Treated 2013   History of anemia    History of bronchitis    History of pneumonia    Hyperlipidemia    Insomnia    Morbid obesity (HCC)    MRSA infection    2007-2009    Patient Active Problem List   Diagnosis Date Noted   Spinal stenosis, lumbar region, with neurogenic claudication 02/28/2014   Dyslipidemia 02/11/2014   Cardiomegaly 01/26/2014   DOE (dyspnea on exertion) 01/26/2014   Preoperative cardiovascular examination 01/26/2014   Right knee pain 11/03/2012   Low back pain 11/03/2012    Helicobacter pylori gastritis 06/29/2012   Hemorrhoids 06/29/2012   Epigastric pain 04/02/2012   GERD (gastroesophageal reflux disease) 04/02/2012   Constipation, chronic 04/02/2012   Esophageal dysphagia 04/02/2012   Rectal bleeding 04/02/2012    Past Surgical History:  Procedure Laterality Date   CARPAL TUNNEL RELEASE Bilateral    COLONOSCOPY  04/13/2012   Rourk-anal canal hemorrhoids/normal colon,rectum   COLONOSCOPY N/A 08/19/2024   Procedure: COLONOSCOPY;  Surgeon: Shaaron Lamar HERO, MD;  Location: [facility];  Service: Endoscopy;  Laterality: N/A;  12:30 pm, asa 2   ENDOSCOPIC CONCHA BULLOSA RESECTION Bilateral 09/08/2017   Procedure: ENDOSCOPIC CONCHA BULLOSA RESECTION;  Surgeon: Karis Clunes, MD;  Location: [facility];  Service: ENT;  Laterality: Bilateral;   ESOPHAGOGASTRODUODENOSCOPY  04/13/2012   Rourk-normal esophaus s/p dilation/h pylori gastritis   GALLBLADDER SURGERY  12/23/2002   MALONEY DILATION  04/13/2012   53F   NASAL SEPTOPLASTY W/ TURBINOPLASTY Bilateral 09/08/2017   Procedure: NASAL SEPTOPLASTY WITH TURBINATE REDUCTION;  Surgeon: Karis Clunes, MD;  Location:  SUR[facility]ERY [facility];  Service: ENT;  Laterality: Bilateral;   SAVORY DILATION  04/13/2012   TOTAL KNEE ARTHROPLASTY Right 2023   TUBAL LIGATION      OB History   No obstetric history on file.      Home Medications  Prior to Admission medications   Medication Sig Start Date End Date Taking? Authorizing Provider  baclofen (LIORESAL) 10 MG tablet Take 1 tablet (10 mg total) by mouth 3 (three) times daily. 09/19/24  Yes Enedelia Dorna HERO, FNP  predniSONE  (DELTASONE ) 20 MG tablet Take 2 tablets (40 mg total) by mouth daily with breakfast for 5 days. 09/19/24 09/24/24 Yes Enedelia Dorna HERO, FNP  conjugated estrogens  (PREMARIN ) vaginal cream 0.5g twice per week 08/29/22   Ozan, Jennifer, DO  meloxicam (MOBIC) 15 MG tablet Take 15 mg by mouth daily.    [provider]   cetirizine  (ZYRTEC ) 10 MG tablet Take 1 tablet (10 mg total) by mouth daily. 01/29/20 05/03/21  Wurst, [place], PA-C  fluticasone  (FLONASE ) 50 MCG/ACT nasal spray Place 2 sprays into both nostrils daily. 01/29/20 05/03/21  Martell Grate, PA-C    Family History Family History  Problem Relation Age of Onset   Healthy Mother    Colon polyps Father        heart disease - stenting, shocked heart twice   Gastric cancer Father        deceased 2012-10-26   Heart disease Maternal Grandmother 2044-10-26   Breast cancer Maternal Grandmother 64   Heart disease Maternal Grandfather 73       also MI   Heart disease Paternal Grandmother 62   Diabetes Paternal Grandmother    Heart disease Paternal Grandfather 40   Heart failure Paternal Grandfather        enlarged heart   Liver disease Neg Hx    Colon cancer Neg Hx     Social History Social History   Tobacco Use   Smoking status: Former    Current packs/day: 2.00    Average packs/day: 2.0 packs/day for 30.0 years (60.0 ttl pk-yrs)    Types: Cigarettes   Smokeless tobacco: Never  Vaping Use   Vaping status: Never Used  Substance Use Topics   Alcohol use: No   Drug use: No     Allergies   Patient has no known allergies.   Review of Systems Review of Systems Per HPI  Physical Exam Triage Vital Signs ED Triage Vitals  Encounter Vitals Group     BP 09/19/24 1110 (!) 141/83     Girls Systolic BP Percentile --      Girls Diastolic BP Percentile --      Boys Systolic BP Percentile --      Boys Diastolic BP Percentile --      Pulse Rate 09/19/24 1110 85     Resp 09/19/24 1110 20     Temp 09/19/24 1110 98.2 F (36.8 C)     Temp Source 09/19/24 1110 Oral     SpO2 09/19/24 1110 97 %     Weight --      Height --      Head Circumference --      Peak Flow --      Pain Score 09/19/24 1111 9     Pain Loc --      Pain Education --      Exclude from Growth Chart --    No data found.  Updated Vital Signs BP (!) 141/83 (BP Location: Left  Arm)   Pulse 85   Temp 98.2 F (36.8 C) (Oral)   Resp 20   SpO2 97%   Visual Acuity Right Eye Distance:   Left Eye Distance:   Bilateral Distance:    Right Eye Near:   Left Eye Near:  Bilateral Near:     Physical Exam Vitals and nursing note reviewed.  Constitutional:      Appearance: She is not ill-appearing or toxic-appearing.  HENT:     Head: Normocephalic and atraumatic.     Right Ear: Hearing and external ear normal.     Left Ear: Hearing and external ear normal.     Nose: Nose normal.     Mouth/Throat:     Lips: Pink.  Eyes:     General: Lids are normal. Vision grossly intact. Gaze aligned appropriately.     Extraocular Movements: Extraocular movements intact.     Conjunctiva/sclera: Conjunctivae normal.  Pulmonary:     Effort: Pulmonary effort is normal.  Musculoskeletal:     Cervical back: Neck supple.     Right knee: Normal.     Left knee: No swelling, deformity, effusion, erythema, ecchymosis, lacerations or bony tenderness. Normal range of motion. Tenderness present over the medial joint line. No lateral joint line or patellar tendon tenderness. Normal pulse.     Comments: Tenderness to the medial joint line of the left knee elicited with valgus stress test. Negative Homan's signs bilaterally, though she is tender to palpation over the proximal left calf and popliteal area. No associated erythema, swelling, warmth. +2 bilateral popliteal pulses and bilateral anterior tibialis pulses. Sensation intact to distal bilateral lower extremities, ambulatory with steady gait. 5/5 strength against resistance with flexion and extension at the left knee.   Skin:    General: Skin is warm and dry.     Capillary Refill: Capillary refill takes less than 2 seconds.     Findings: No rash.  Neurological:     General: No focal deficit present.     Mental Status: She is alert and oriented to person, place, and time. Mental status is at baseline.     Cranial Nerves: No dysarthria  or facial asymmetry.  Psychiatric:        Mood and Affect: Mood normal.        Speech: Speech normal.        Behavior: Behavior normal.        Thought Content: Thought content normal.        Judgment: Judgment normal.      UC Treatments / Results  Labs (all labs ordered are listed, but only abnormal results are displayed) Labs Reviewed - No data to display  EKG   Radiology No results found.  Procedures Procedures (including critical care time)  Medications Ordered in UC Medications - No data to display  Initial Impression / Assessment and Plan / UC Course  I have reviewed the triage vital signs and the nursing notes.  Pertinent labs & imaging results that were available during my care of the patient were reviewed by me and considered in my medical decision making (see chart for details).   1. Acute pain of left knee Suspect arthritis flare up, no injuries, therefore deferred imaging of the left knee. No signs of infection/DVT.  Pain has not responded well to use of Mobic/tylenol , therefore short course of prednisone  has been ordered. RICE recommended. Encouraged follow-up with orthopedic specialist for ongoing evaluation and management of chronic knee pain.   Counseled patient on potential for adverse effects with medications prescribed/recommended today, strict ER and return-to-clinic precautions discussed, patient verbalized understanding.    Final Clinical Impressions(s) / UC Diagnoses   Final diagnoses:  Acute pain of left knee     Discharge Instructions      Your  left knee pain is likely due to arthritis.   Prednisone  40mg  once daily for the next 5 days each morning with breakfast.   You may take tylenol  as needed for aches and pains.  Take muscle relaxer as needed for muscle spasm, mostly take this at bedtime as this medicine can cause drowsiness.  Apply heat to the pulled muscle 20 minutes on 20 minutes off as needed, heat relaxes muscles.  Perform  gentle exercises and stretches to area of tenderness.  I would like for you to rest, however I do not want you to avoid moving the area. Movement and stretching will help with healing.  If you develop any new or worsening symptoms or if your symptoms do not start to improve, please return here or follow-up with your primary care provider. If your symptoms are severe, please go to the emergency room.    ED Prescriptions     Medication Sig Dispense Auth. Provider   predniSONE  (DELTASONE ) 20 MG tablet Take 2 tablets (40 mg total) by mouth daily with breakfast for 5 days. 10 tablet Enedelia Dorna HERO, FNP   baclofen (LIORESAL) 10 MG tablet Take 1 tablet (10 mg total) by mouth 3 (three) times daily. 30 each Enedelia Dorna HERO, FNP      PDMP not reviewed this encounter.   Enedelia Dorna HERO, [place] 09/26/24 1013

## 2024-09-19 NOTE — ED Triage Notes (Signed)
 Pt reports she has had left knee pain x 5 days Painful to walk. States the pain is on the medial side of her left knee cap.   Took tylenol  but no relief

## 2024-10-15 ENCOUNTER — Encounter: Payer: Self-pay | Admitting: Internal Medicine

## 2024-11-03 ENCOUNTER — Other Ambulatory Visit: Payer: Self-pay

## 2024-11-03 ENCOUNTER — Ambulatory Visit
Admission: RE | Admit: 2024-11-03 | Discharge: 2024-11-03 | Disposition: A | Discharge status: Home / Self Care | Source: Ambulatory Visit

## 2024-11-03 VITALS — BP 137/94 | HR 77 | Temp 98.3°F | Resp 20

## 2024-11-03 DIAGNOSIS — J0140 Acute pansinusitis, unspecified: Secondary | ICD-10-CM

## 2024-11-03 MED ORDER — AMOXICILLIN-POT CLAVULANATE 875-125 MG PO TABS: 1.0000 | ORAL_TABLET | Freq: Two times a day (BID) | ORAL | 0 refills | Status: AC

## 2024-11-03 MED ORDER — AZELASTINE HCL 0.1 % NA SOLN: 2.0000 | Freq: Two times a day (BID) | NASAL | 0 refills | Status: AC

## 2024-11-03 NOTE — ED Provider Notes (Signed)
 [facility]    CSN: [id number] Arrival date & time: 11/03/24  1051      History   Chief Complaint Chief Complaint  Patient presents with   Sore Throat    Sinus congestion and cough. Took over the counter medication and it's been 2 weeks. Not any better. Also some ear pain - Entered by patient    HPI Tammy Webster is a 63 y.o. female.   The history is provided by the patient.   Patient presents with a 2-week history of headache, nasal congestion, sinus pressure, cough, and bilateral ear fullness.  Patient states that she believes she did have a fever since her symptoms started.  Denies ear drainage, wheezing, difficulty breathing, abdominal pain, nausea, vomiting, diarrhea, or rash.  Patient states she has been using the Nettie pot and over-the-counter medications for her symptoms with minimal relief.  Past Medical History:  Diagnosis Date   Anxiety    Arthritis    Chronic back pain    Chronic constipation    COVID-19    Elevated BP without diagnosis of hypertension    Gastric ulcer    Headache(784.0)    Helicobacter pylori gastritis    Treated 2013   History of anemia    History of bronchitis    History of pneumonia    Hyperlipidemia    Insomnia    Morbid obesity (HCC)    MRSA infection    2007-2009    Patient Active Problem List   Diagnosis Date Noted   Spinal stenosis, lumbar region, with neurogenic claudication 02/28/2014   Dyslipidemia 02/11/2014   Cardiomegaly 01/26/2014   DOE (dyspnea on exertion) 01/26/2014   Preoperative cardiovascular examination 01/26/2014   Right knee pain 11/03/2012   Low back pain 11/03/2012   Helicobacter pylori gastritis 06/29/2012   Hemorrhoids 06/29/2012   Epigastric pain 04/02/2012   GERD (gastroesophageal reflux disease) 04/02/2012   Constipation, chronic 04/02/2012   Esophageal dysphagia 04/02/2012   Rectal bleeding 04/02/2012    Past Surgical History:  Procedure Laterality Date   CARPAL TUNNEL RELEASE  Bilateral    COLONOSCOPY  04/13/2012   Rourk-anal canal hemorrhoids/normal colon,rectum   COLONOSCOPY N/A 08/19/2024   Procedure: COLONOSCOPY;  Surgeon: Shaaron Lamar HERO, MD;  Location: [facility];  Service: Endoscopy;  Laterality: N/A;  12:30 pm, asa 2   ENDOSCOPIC CONCHA BULLOSA RESECTION Bilateral 09/08/2017   Procedure: ENDOSCOPIC CONCHA BULLOSA RESECTION;  Surgeon: Karis Clunes, MD;  Location: [facility];  Service: ENT;  Laterality: Bilateral;   ESOPHAGOGASTRODUODENOSCOPY  04/13/2012   Rourk-normal esophaus s/p dilation/h pylori gastritis   GALLBLADDER SURGERY  12/23/2002   MALONEY DILATION  04/13/2012   10F   NASAL SEPTOPLASTY W/ TURBINOPLASTY Bilateral 09/08/2017   Procedure: NASAL SEPTOPLASTY WITH TURBINATE REDUCTION;  Surgeon: Karis Clunes, MD;  Location: [facility];  Service: ENT;  Laterality: Bilateral;   SAVORY DILATION  04/13/2012   TOTAL KNEE ARTHROPLASTY Right 2023   TUBAL LIGATION      OB History   No obstetric history on file.      Home Medications    Prior to Admission medications   Medication Sig Start Date End Date Taking? Authorizing Provider  amoxicillin -clavulanate (AUGMENTIN ) 875-125 MG tablet Take 1 tablet by mouth every 12 (twelve) hours. 11/03/24  Yes Leath-Warren, Etta PARAS, NP  azelastine (ASTELIN) 0.1 % nasal spray Place 2 sprays into both nostrils 2 (two) times daily. Use in each nostril as directed 11/03/24  Yes Leath-Warren, Etta  J, NP  benzonatate  (TESSALON ) 200 MG capsule Take 200 mg by mouth 3 (three) times daily. 10/25/24  Yes [provider]  oxyCODONE  (OXY IR/ROXICODONE ) 5 MG immediate release tablet Take 1 tablet every 8 hours by oral route as needed for 5 days, for pain. 10/21/24  Yes [provider]  baclofen (LIORESAL) 10 MG tablet Take 1 tablet (10 mg total) by mouth 3 (three) times daily. 09/19/24   Enedelia Dorna HERO, FNP  conjugated estrogens  (PREMARIN ) vaginal cream 0.5g twice per week  08/29/22   Ozan, Jennifer, DO  meloxicam (MOBIC) 15 MG tablet Take 15 mg by mouth daily.    [provider]  cetirizine  (ZYRTEC ) 10 MG tablet Take 1 tablet (10 mg total) by mouth daily. 01/29/20 05/03/21  Wurst, Brittany, PA-C  fluticasone  (FLONASE ) 50 MCG/ACT nasal spray Place 2 sprays into both nostrils daily. 01/29/20 05/03/21  Martell Grate, PA-C    Family History Family History  Problem Relation Age of Onset   Healthy Mother    Colon polyps Father        heart disease - stenting, shocked heart twice   Gastric cancer Father        deceased 11/25/12   Heart disease Maternal Grandmother Nov 25, 2044   Breast cancer Maternal Grandmother 63   Heart disease Maternal Grandfather 53       also MI   Heart disease Paternal Grandmother 76   Diabetes Paternal Grandmother    Heart disease Paternal Grandfather 22   Heart failure Paternal Grandfather        enlarged heart   Liver disease Neg Hx    Colon cancer Neg Hx     Social History Social History   Tobacco Use   Smoking status: Former    Current packs/day: 2.00    Average packs/day: 2.0 packs/day for 30.0 years (60.0 ttl pk-yrs)    Types: Cigarettes   Smokeless tobacco: Never  Vaping Use   Vaping status: Never Used  Substance Use Topics   Alcohol use: No   Drug use: No     Allergies   Patient has no known allergies.   Review of Systems Review of Systems Per HPI  Physical Exam Triage Vital Signs ED Triage Vitals  Encounter Vitals Group     BP 11/03/24 1113 (!) 137/94     Girls Systolic BP Percentile --      Girls Diastolic BP Percentile --      Boys Systolic BP Percentile --      Boys Diastolic BP Percentile --      Pulse Rate 11/03/24 1113 77     Resp 11/03/24 1113 20     Temp 11/03/24 1113 98.3 F (36.8 C)     Temp Source 11/03/24 1113 Oral     SpO2 11/03/24 1113 95 %     Weight --      Height --      Head Circumference --      Peak Flow --      Pain Score 11/03/24 1109 1     Pain Loc --      Pain Education  --      Exclude from Growth Chart --    No data found.  Updated Vital Signs BP (!) 137/94 (BP Location: Right Arm)   Pulse 77   Temp 98.3 F (36.8 C) (Oral)   Resp 20   SpO2 95%   Visual Acuity Right Eye Distance:   Left Eye Distance:   Bilateral Distance:  Right Eye Near:   Left Eye Near:    Bilateral Near:     Physical Exam Vitals and nursing note reviewed.  Constitutional:      General: She is not in acute distress.    Appearance: Normal appearance. She is well-developed.  HENT:     Head: Normocephalic and atraumatic.     Right Ear: Tympanic membrane, ear canal and external ear normal.     Left Ear: Tympanic membrane, ear canal and external ear normal.     Nose: Congestion present.     Right Turbinates: Enlarged and swollen.     Left Turbinates: Enlarged and swollen.     Right Sinus: Maxillary sinus tenderness and frontal sinus tenderness present.     Left Sinus: Maxillary sinus tenderness and frontal sinus tenderness present.     Mouth/Throat:     Lips: Pink.     Mouth: Mucous membranes are moist.     Pharynx: Uvula midline. Posterior oropharyngeal erythema and postnasal drip present. No pharyngeal swelling, oropharyngeal exudate or uvula swelling.     Comments: Cobblestoning present to posterior oropharynx  Eyes:     Extraocular Movements: Extraocular movements intact.     Conjunctiva/sclera: Conjunctivae normal.     Pupils: Pupils are equal, round, and reactive to light.  Neck:     Thyroid: No thyromegaly.     Trachea: No tracheal deviation.  Cardiovascular:     Rate and Rhythm: Normal rate and regular rhythm.     Pulses: Normal pulses.     Heart sounds: Normal heart sounds.  Pulmonary:     Effort: Pulmonary effort is normal. No respiratory distress.     Breath sounds: Normal breath sounds. No stridor. No wheezing, rhonchi or rales.  Abdominal:     General: Bowel sounds are normal.     Palpations: Abdomen is soft.     Tenderness: There is no abdominal  tenderness.  Musculoskeletal:     Cervical back: Normal range of motion and neck supple.  Skin:    General: Skin is warm and dry.  Neurological:     General: No focal deficit present.     Mental Status: She is alert and oriented to person, place, and time.  Psychiatric:        Mood and Affect: Mood normal.        Behavior: Behavior normal.        Thought Content: Thought content normal.        Judgment: Judgment normal.      UC Treatments / Results  Labs (all labs ordered are listed, but only abnormal results are displayed) Labs Reviewed - No data to display  EKG   Radiology No results found.  Procedures Procedures (including critical care time)  Medications Ordered in UC Medications - No data to display  Initial Impression / Assessment and Plan / UC Course  I have reviewed the triage vital signs and the nursing notes.  Pertinent labs & imaging results that were available during my care of the patient were reviewed by me and considered in my medical decision making (see chart for details).  Patient presents with upper respiratory symptoms have been present for the past 2 weeks.  On exam, she has both frontal and maxillary sinus tenderness.  Her symptoms have persisted despite use of over-the-counter medications.  Concern for bacterial etiology.  Will treat for acute pansinusitis with Augmentin  875/125 mg and azelastine nasal spray.  Supportive care recommendations were provided and discussed with the patient  to include fluids, rest, over-the-counter analgesics such as Tylenol , and use of over-the-counter cough and cold medications as needed.  Discussed indications with the patient regarding follow-up.  Patient was in agreement with this plan of care and verbalizes understanding.  All questions were answered.  Patient stable for discharge.  Final Clinical Impressions(s) / UC Diagnoses   Final diagnoses:  Acute pansinusitis, recurrence not specified     Discharge  Instructions      Take medication as directed. Commend over-the-counter cough medication such as Robitussin, Delsym, or Mucinex as needed. Increase fluids and get plenty of rest. You may take over-the-counter Tylenol  as needed for pain, fever, or general discomfort. Recommend continuing normal saline nasal spray to help with nasal congestion throughout the day. For your cough, it may be helpful to use a humidifier at bedtime during sleep. If symptoms fail to improve with this treatment, or begin to worsen, you may follow-up in this clinic or with your primary care physician for further evaluation. Follow-up as needed.     ED Prescriptions     Medication Sig Dispense Auth. Provider   amoxicillin -clavulanate (AUGMENTIN ) 875-125 MG tablet Take 1 tablet by mouth every 12 (twelve) hours. 14 tablet Leath-Warren, Etta PARAS, NP   azelastine (ASTELIN) 0.1 % nasal spray Place 2 sprays into both nostrils 2 (two) times daily. Use in each nostril as directed 30 mL Leath-Warren, Etta PARAS, NP      PDMP not reviewed this encounter.   Gilmer Etta PARAS, NP 11/03/24 1134

## 2024-11-03 NOTE — ED Triage Notes (Signed)
 Pt reports cough, nasal congestion, sore throat, right ear pressure x2 weeks. Reports intermittent fevers. OTC medication has not helped with symptoms.

## 2024-11-03 NOTE — Discharge Instructions (Signed)
 Take medication as directed. Commend over-the-counter cough medication such as Robitussin, Delsym, or Mucinex as needed. Increase fluids and get plenty of rest. You may take over-the-counter Tylenol  as needed for pain, fever, or general discomfort. Recommend continuing normal saline nasal spray to help with nasal congestion throughout the day. For your cough, it may be helpful to use a humidifier at bedtime during sleep. If symptoms fail to improve with this treatment, or begin to worsen, you may follow-up in this clinic or with your primary care physician for further evaluation. Follow-up as needed.
